# Patient Record
Sex: Female | Born: 1940 | Race: White | Hispanic: No | Marital: Married | Smoking: Never smoker
Health system: Southern US, Community
[De-identification: ages and names within clinical notes are randomized; demographics above are authoritative.]

## PROBLEM LIST (undated history)

## (undated) DIAGNOSIS — E890 Postprocedural hypothyroidism: Secondary | ICD-10-CM

## (undated) DIAGNOSIS — R519 Headache, unspecified: Secondary | ICD-10-CM

## (undated) DIAGNOSIS — C50919 Malignant neoplasm of unspecified site of unspecified female breast: Secondary | ICD-10-CM

## (undated) DIAGNOSIS — Z923 Personal history of irradiation: Secondary | ICD-10-CM

## (undated) DIAGNOSIS — K644 Residual hemorrhoidal skin tags: Secondary | ICD-10-CM

## (undated) DIAGNOSIS — H409 Unspecified glaucoma: Secondary | ICD-10-CM

## (undated) DIAGNOSIS — Z87898 Personal history of other specified conditions: Secondary | ICD-10-CM

## (undated) DIAGNOSIS — M1612 Unilateral primary osteoarthritis, left hip: Secondary | ICD-10-CM

## (undated) DIAGNOSIS — I440 Atrioventricular block, first degree: Secondary | ICD-10-CM

## (undated) DIAGNOSIS — Z973 Presence of spectacles and contact lenses: Secondary | ICD-10-CM

## (undated) DIAGNOSIS — K648 Other hemorrhoids: Secondary | ICD-10-CM

## (undated) DIAGNOSIS — Z974 Presence of external hearing-aid: Secondary | ICD-10-CM

## (undated) DIAGNOSIS — R51 Headache: Secondary | ICD-10-CM

## (undated) HISTORY — PX: TONSILLECTOMY: SUR1361

## (undated) HISTORY — PX: CARPAL TUNNEL RELEASE: SHX101

## (undated) HISTORY — PX: KNEE ARTHROSCOPY: SUR90

## (undated) HISTORY — PX: APPENDECTOMY: SHX54

## (undated) HISTORY — PX: HAMMER TOE SURGERY: SHX385

## (undated) HISTORY — PX: TUBAL LIGATION: SHX77

## (undated) HISTORY — PX: COLONOSCOPY W/ BIOPSIES AND POLYPECTOMY: SHX1376

---

## 1972-10-25 HISTORY — PX: CHOLECYSTECTOMY OPEN: SUR202

## 1997-10-25 HISTORY — PX: OTHER SURGICAL HISTORY: SHX169

## 1998-04-28 ENCOUNTER — Inpatient Hospital Stay (HOSPITAL_COMMUNITY): Admission: EM | Admit: 1998-04-28 | Discharge: 1998-04-29 | Payer: Self-pay | Admitting: Emergency Medicine

## 1999-11-11 ENCOUNTER — Encounter: Admission: RE | Admit: 1999-11-11 | Discharge: 1999-11-11 | Payer: Self-pay | Admitting: Family Medicine

## 1999-11-11 ENCOUNTER — Encounter: Payer: Self-pay | Admitting: Family Medicine

## 2000-11-11 ENCOUNTER — Encounter: Payer: Self-pay | Admitting: Family Medicine

## 2000-11-11 ENCOUNTER — Encounter: Admission: RE | Admit: 2000-11-11 | Discharge: 2000-11-11 | Payer: Self-pay | Admitting: Family Medicine

## 2001-11-14 ENCOUNTER — Encounter: Payer: Self-pay | Admitting: Family Medicine

## 2001-11-14 ENCOUNTER — Encounter: Admission: RE | Admit: 2001-11-14 | Discharge: 2001-11-14 | Payer: Self-pay | Admitting: Family Medicine

## 2002-11-15 ENCOUNTER — Encounter: Payer: Self-pay | Admitting: Family Medicine

## 2002-11-15 ENCOUNTER — Encounter: Admission: RE | Admit: 2002-11-15 | Discharge: 2002-11-15 | Payer: Self-pay | Admitting: Family Medicine

## 2002-12-10 ENCOUNTER — Encounter: Payer: Self-pay | Admitting: Family Medicine

## 2002-12-10 ENCOUNTER — Ambulatory Visit (HOSPITAL_COMMUNITY): Admission: RE | Admit: 2002-12-10 | Discharge: 2002-12-10 | Payer: Self-pay | Admitting: Family Medicine

## 2002-12-14 ENCOUNTER — Encounter: Admission: RE | Admit: 2002-12-14 | Discharge: 2002-12-14 | Payer: Self-pay | Admitting: Family Medicine

## 2002-12-14 ENCOUNTER — Encounter: Payer: Self-pay | Admitting: Family Medicine

## 2002-12-27 ENCOUNTER — Ambulatory Visit (HOSPITAL_COMMUNITY): Admission: RE | Admit: 2002-12-27 | Discharge: 2002-12-27 | Payer: Self-pay | Admitting: Family Medicine

## 2002-12-27 ENCOUNTER — Encounter: Payer: Self-pay | Admitting: Family Medicine

## 2003-09-10 ENCOUNTER — Encounter: Admission: RE | Admit: 2003-09-10 | Discharge: 2003-09-10 | Payer: Self-pay | Admitting: Family Medicine

## 2003-11-26 ENCOUNTER — Encounter: Admission: RE | Admit: 2003-11-26 | Discharge: 2003-11-26 | Payer: Self-pay | Admitting: Family Medicine

## 2003-11-29 ENCOUNTER — Other Ambulatory Visit: Admission: RE | Admit: 2003-11-29 | Discharge: 2003-11-29 | Payer: Self-pay | Admitting: Family Medicine

## 2004-05-19 ENCOUNTER — Encounter: Admission: RE | Admit: 2004-05-19 | Discharge: 2004-05-19 | Payer: Self-pay | Admitting: Family Medicine

## 2004-12-01 ENCOUNTER — Encounter: Admission: RE | Admit: 2004-12-01 | Discharge: 2004-12-01 | Payer: Self-pay | Admitting: Family Medicine

## 2004-12-01 ENCOUNTER — Other Ambulatory Visit: Admission: RE | Admit: 2004-12-01 | Discharge: 2004-12-01 | Payer: Self-pay | Admitting: Family Medicine

## 2005-12-02 ENCOUNTER — Encounter: Admission: RE | Admit: 2005-12-02 | Discharge: 2005-12-02 | Payer: Self-pay | Admitting: Family Medicine

## 2005-12-03 ENCOUNTER — Other Ambulatory Visit: Admission: RE | Admit: 2005-12-03 | Discharge: 2005-12-03 | Payer: Self-pay | Admitting: Family Medicine

## 2006-12-06 ENCOUNTER — Encounter: Admission: RE | Admit: 2006-12-06 | Discharge: 2006-12-06 | Payer: Self-pay | Admitting: Family Medicine

## 2006-12-21 ENCOUNTER — Encounter: Admission: RE | Admit: 2006-12-21 | Discharge: 2006-12-21 | Payer: Self-pay | Admitting: Family Medicine

## 2007-10-09 ENCOUNTER — Encounter: Admission: RE | Admit: 2007-10-09 | Discharge: 2007-10-09 | Payer: Self-pay | Admitting: Family Medicine

## 2007-12-22 ENCOUNTER — Other Ambulatory Visit: Admission: RE | Admit: 2007-12-22 | Discharge: 2007-12-22 | Payer: Self-pay | Admitting: Family Medicine

## 2008-01-11 ENCOUNTER — Encounter: Admission: RE | Admit: 2008-01-11 | Discharge: 2008-01-11 | Payer: Self-pay | Admitting: Family Medicine

## 2008-12-25 ENCOUNTER — Other Ambulatory Visit: Admission: RE | Admit: 2008-12-25 | Discharge: 2008-12-25 | Payer: Self-pay | Admitting: Family Medicine

## 2009-01-13 ENCOUNTER — Encounter: Admission: RE | Admit: 2009-01-13 | Discharge: 2009-01-13 | Payer: Self-pay | Admitting: Family Medicine

## 2010-01-15 ENCOUNTER — Encounter: Admission: RE | Admit: 2010-01-15 | Discharge: 2010-01-15 | Payer: Self-pay | Admitting: Family Medicine

## 2010-07-07 ENCOUNTER — Encounter (INDEPENDENT_AMBULATORY_CARE_PROVIDER_SITE_OTHER): Payer: Self-pay | Admitting: Obstetrics and Gynecology

## 2010-07-07 ENCOUNTER — Ambulatory Visit (HOSPITAL_COMMUNITY): Admission: RE | Admit: 2010-07-07 | Discharge: 2010-07-08 | Payer: Self-pay | Admitting: Obstetrics and Gynecology

## 2010-07-07 HISTORY — PX: VAGINAL HYSTERECTOMY: SUR661

## 2010-12-14 ENCOUNTER — Other Ambulatory Visit: Payer: Self-pay | Admitting: Family Medicine

## 2010-12-14 DIAGNOSIS — Z1231 Encounter for screening mammogram for malignant neoplasm of breast: Secondary | ICD-10-CM

## 2011-01-07 LAB — BASIC METABOLIC PANEL
BUN: 19 mg/dL (ref 6–23)
Calcium: 9.2 mg/dL (ref 8.4–10.5)
Creatinine, Ser: 0.81 mg/dL (ref 0.4–1.2)
Creatinine, Ser: 1.01 mg/dL (ref 0.4–1.2)
GFR calc non Af Amer: >60 mL/min (ref >60)
Glucose, Bld: 102 mg/dL — ABNORMAL HIGH (ref 70–99)
Sodium: 141 mEq/L (ref 135–145)

## 2011-01-07 LAB — CBC
HCT: 31.2 % — ABNORMAL LOW (ref 36.0–46.0)
HCT: 38.3 % (ref 36.0–46.0)
Hemoglobin: 10.9 g/dL — ABNORMAL LOW (ref 12.0–15.0)
Hemoglobin: 13.3 g/dL (ref 12.0–15.0)
MCH: 34.1 pg — ABNORMAL HIGH (ref 26.0–34.0)
MCH: 34.3 pg — ABNORMAL HIGH (ref 26.0–34.0)
MCHC: 34.9 g/dL (ref 30.0–36.0)
MCV: 98.2 fL (ref 78.0–100.0)
MCV: 98.3 fL (ref 78.0–100.0)
RBC: 3.18 MIL/uL — ABNORMAL LOW (ref 3.87–5.11)
RBC: 3.9 MIL/uL (ref 3.87–5.11)
RDW: 12.7 % (ref 11.5–15.5)

## 2011-01-07 LAB — SURGICAL PCR SCREEN: MRSA, PCR: NEGATIVE

## 2011-01-18 ENCOUNTER — Ambulatory Visit
Admission: RE | Admit: 2011-01-18 | Discharge: 2011-01-18 | Disposition: A | Discharge status: Home / Self Care | Payer: Self-pay | Source: Ambulatory Visit | Attending: Family Medicine | Admitting: Family Medicine

## 2011-01-18 DIAGNOSIS — Z1231 Encounter for screening mammogram for malignant neoplasm of breast: Secondary | ICD-10-CM

## 2011-06-02 NOTE — Patient Instructions (Addendum)
20 EDDY TERMINE  06/03/2011   Your procedure is scheduled on:  06/10/11  Report to [facility] at 0715 AM. Main entrance front desk phone [phone]  Call this number if you have problems the morning of surgery: [phone]   Remember:   Do not eat food:After Midnight.  Do not drink clear liquids: After Midnight.  Take these medicines the morning of surgery with A SIP OF WATER: thyroid med   Do not wear jewelry, make-up or nail polish.  Do not wear lotions, powders, or perfumes. You may wear deodorant.  Do not shave 48 hours prior to surgery.  Do not bring valuables to the hospital.  Contacts, dentures or bridgework may not be worn into surgery.  Leave suitcase in the car. After surgery it may be brought to your room.  For patients admitted to the hospital, checkout time is 11:00 AM the day of discharge.   Patients discharged the day of surgery will not be allowed to drive home.  Name and phone number of your driver: Dorinda Hill- spouse- [phone]  Special Instructions: CHG Shower Use Special Wash: 1/2 bottle night before surgery and 1/2 bottle morning of surgery.   Please read over the following fact sheets that you were given: Care and Recovery After Surgery

## 2011-06-03 ENCOUNTER — Encounter (HOSPITAL_COMMUNITY)
Admission: RE | Admit: 2011-06-03 | Discharge: 2011-06-03 | Disposition: A | Discharge status: Home / Self Care | Payer: Medicare Other | Source: Ambulatory Visit | Attending: Obstetrics and Gynecology | Admitting: Obstetrics and Gynecology

## 2011-06-03 ENCOUNTER — Encounter (HOSPITAL_COMMUNITY): Payer: Self-pay

## 2011-06-03 ENCOUNTER — Other Ambulatory Visit: Payer: Self-pay

## 2011-06-03 LAB — BASIC METABOLIC PANEL
BUN: 21 mg/dL (ref 6–23)
CO2: 29 mEq/L (ref 19–32)
Calcium: 10.3 mg/dL (ref 8.4–10.5)
Chloride: 101 mEq/L (ref 96–112)
Creatinine, Ser: 0.82 mg/dL (ref 0.50–1.10)

## 2011-06-03 LAB — CBC
HCT: 38.6 % (ref 36.0–46.0)
MCHC: 33.7 g/dL (ref 30.0–36.0)
MCV: 96.5 fL (ref 78.0–100.0)
Platelets: 258 10*3/uL (ref 150–400)
RDW: 13 % (ref 11.5–15.5)
WBC: 7 10*3/uL (ref 4.0–10.5)

## 2011-06-09 MED ORDER — DEXTROSE 5 % IV SOLN
2.0000 g | INTRAVENOUS | Status: DC
  Filled 2011-06-09: qty 2

## 2011-06-09 NOTE — H&P (Signed)
[name], [name] NO.:  [id number]  MEDICAL RECORD NO.:  [id number]  LOCATION:                                 FACILITY:  PHYSICIAN:  Zenaida Niece, M.D.DATE OF BIRTH:  21-Mar-1941  DATE OF ADMISSION:  06/10/2011 DATE OF DISCHARGE:                             HISTORY & PHYSICAL   CHIEF COMPLAINT:  Vaginal vault prolapse.  HISTORY OF PRESENT ILLNESS:  This is a 70 year old female para 1-0-0-1 who underwent vaginal hysterectomy with A and P repair and Solyx sling last year by Dr. Ellyn Hack.  She has presented with recurrent vaginal bulge.  Dr. Ellyn Hack first saw her in April of this year.  She felt she had a prolapsed anterior vaginal vault.  On further evaluation, I believe she has a prolapse of the apical vaginal vault with minimal cystocele and rectocele.  She tried a pessary for this, but it fell out, and she does not wish to have it replaced.  Other options have been discussed and she wishes to proceed with definitive surgical therapy and is being admitted for this at this time.  PAST OB HISTORY:  One vaginal delivery in 1973 without complications.  PAST MEDICAL HISTORY:  Thyroid disorder and history of abnormal Pap smears, hyperlipidemia.  PAST SURGICAL HISTORY:  The above-mentioned vaginal hysterectomy with A and P repair and Solyx sling, ankle surgery, appendectomy, cholecystectomy, and tonsillectomy as well as previous tubal ligation.  ALLERGIES:  None known.  CURRENT MEDICATIONS:  Baby aspirin, Betimol ophthalmic solution, fenofibrate, and Levoxyl.  FAMILY MEDICAL HISTORY:  The patient is adopted.  REVIEW OF SYSTEMS:  Otherwise negative.  She has no bowel or bladder complications.  SOCIAL HISTORY:  She is married and is sexually active and denies significant alcohol, tobacco, or drug use.  PHYSICAL EXAMINATION:  GENERAL:  This is a well-developed female in no acute distress. NECK:  Supple without lymphadenopathy or thyromegaly. LUNGS:   Clear to auscultation. HEART:  Regular rate and rhythm without murmur. ABDOMEN:  Soft, nontender, nondistended, and she does have scars in her right upper quadrant and right lower quadrant. EXTREMITIES:  No edema and are nontender. PELVIC:  External genitalia has no lesions. VAGINAL:  The vaginal apex comes to just past the introitus and she has grade 1 cystocele and rectocele and no mass.  On bimanual exam, she has no adnexal mass or tenderness.  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  The Solyx sling appears to be in good position.  She has minimal cystocele and rectocele, mostly vaginal vault prolapse.  All nonsurgical and surgical options have been discussed with the patient.  Again, she has previously tried a pessary and this did not work well.  She does wish to undergo definitive surgical therapy.  The procedure and all potential risks have been discussed with the patient.  PLAN:  To admit the patient on June 10, 2011, for minimal A and P repair and sacrospinous vaginal vault suspension.     Zenaida Niece, M.D.     TDM/MEDQ  D:  06/09/2011  T:  06/09/2011  Job:  [id number]

## 2011-06-10 ENCOUNTER — Encounter (HOSPITAL_COMMUNITY)
Admission: RE | Disposition: A | Discharge status: Home / Self Care | Payer: Self-pay | Source: Ambulatory Visit | Attending: Obstetrics and Gynecology

## 2011-06-10 ENCOUNTER — Inpatient Hospital Stay (HOSPITAL_COMMUNITY)
Admission: RE | Admit: 2011-06-10 | Discharge: 2011-06-11 | DRG: 748 | Disposition: A | Discharge status: Home / Self Care | Payer: Medicare Other | Source: Ambulatory Visit | Attending: Obstetrics and Gynecology | Admitting: Obstetrics and Gynecology

## 2011-06-10 ENCOUNTER — Encounter (HOSPITAL_COMMUNITY): Payer: Self-pay | Admitting: Anesthesiology

## 2011-06-10 ENCOUNTER — Ambulatory Visit (HOSPITAL_COMMUNITY): Payer: Medicare Other | Admitting: Anesthesiology

## 2011-06-10 DIAGNOSIS — N993 Prolapse of vaginal vault after hysterectomy: Principal | ICD-10-CM | POA: Diagnosis present

## 2011-06-10 DIAGNOSIS — Z01818 Encounter for other preprocedural examination: Secondary | ICD-10-CM

## 2011-06-10 DIAGNOSIS — Z01812 Encounter for preprocedural laboratory examination: Secondary | ICD-10-CM

## 2011-06-10 HISTORY — PX: ANTERIOR AND POSTERIOR REPAIR: SHX5121

## 2011-06-10 HISTORY — PX: VAGINAL PROLAPSE REPAIR: SHX830

## 2011-06-10 SURGERY — ANTERIOR (CYSTOCELE) AND POSTERIOR REPAIR (RECTOCELE)
Anesthesia: General | Site: Vagina | Wound class: Clean Contaminated

## 2011-06-10 MED ORDER — OXYCODONE-ACETAMINOPHEN 5-325 MG PO TABS: 1.0000 | ORAL_TABLET | ORAL | Status: DC | PRN

## 2011-06-10 MED ORDER — DEXAMETHASONE SODIUM PHOSPHATE 4 MG/ML IJ SOLN
INTRAMUSCULAR | Status: DC | PRN
  Administered 2011-06-10: 10 mg via INTRAVENOUS

## 2011-06-10 MED ORDER — SODIUM CHLORIDE 0.9 % IJ SOLN: 9.0000 mL | INTRAMUSCULAR | Status: DC | PRN

## 2011-06-10 MED ORDER — FENTANYL CITRATE 0.05 MG/ML IJ SOLN
INTRAMUSCULAR | Status: DC | PRN
  Administered 2011-06-10: 100 ug via INTRAVENOUS
  Administered 2011-06-10: 50 ug via INTRAVENOUS
  Administered 2011-06-10 (×2): 25 ug via INTRAVENOUS
  Administered 2011-06-10: 50 ug via INTRAVENOUS

## 2011-06-10 MED ORDER — FENTANYL CITRATE 0.05 MG/ML IJ SOLN
INTRAMUSCULAR | Status: AC
  Filled 2011-06-10: qty 5

## 2011-06-10 MED ORDER — PROPOFOL 10 MG/ML IV EMUL
INTRAVENOUS | Status: AC
  Filled 2011-06-10: qty 20

## 2011-06-10 MED ORDER — HYDROMORPHONE 0.3 MG/ML IV SOLN
INTRAVENOUS | Status: DC
  Administered 2011-06-10: 12:00:00 via INTRAVENOUS
  Administered 2011-06-10 – 2011-06-11 (×3): 0 mg via INTRAVENOUS

## 2011-06-10 MED ORDER — HYDROMORPHONE HCL 1 MG/ML IJ SOLN: 0.2500 mg | INTRAMUSCULAR | Status: DC | PRN

## 2011-06-10 MED ORDER — ONDANSETRON HCL 4 MG/2ML IJ SOLN
INTRAMUSCULAR | Status: AC
  Filled 2011-06-10: qty 2

## 2011-06-10 MED ORDER — DOCUSATE SODIUM 100 MG PO CAPS
100.0000 mg | ORAL_CAPSULE | Freq: Every day | ORAL | Status: DC
  Administered 2011-06-11: 100 mg via ORAL
  Filled 2011-06-10: qty 1

## 2011-06-10 MED ORDER — ONDANSETRON HCL 4 MG/2ML IJ SOLN: 4.0000 mg | Freq: Once | INTRAMUSCULAR | Status: DC | PRN

## 2011-06-10 MED ORDER — ONDANSETRON HCL 4 MG/2ML IJ SOLN
INTRAMUSCULAR | Status: DC | PRN
  Administered 2011-06-10: 4 mg via INTRAVENOUS

## 2011-06-10 MED ORDER — ZOLPIDEM TARTRATE 5 MG PO TABS: 5.0000 mg | ORAL_TABLET | Freq: Every evening | ORAL | Status: DC | PRN

## 2011-06-10 MED ORDER — KETOROLAC TROMETHAMINE 30 MG/ML IJ SOLN
INTRAMUSCULAR | Status: DC | PRN
  Administered 2011-06-10: 30 mg via INTRAVENOUS

## 2011-06-10 MED ORDER — ONDANSETRON HCL 4 MG/2ML IJ SOLN: 4.0000 mg | Freq: Four times a day (QID) | INTRAMUSCULAR | Status: DC | PRN

## 2011-06-10 MED ORDER — SODIUM CHLORIDE 0.9 % IR SOLN
Status: DC | PRN
  Administered 2011-06-10: 1000 mL

## 2011-06-10 MED ORDER — IBUPROFEN 600 MG PO TABS: 600.0000 mg | ORAL_TABLET | Freq: Four times a day (QID) | ORAL | Status: DC | PRN

## 2011-06-10 MED ORDER — SENNOSIDES-DOCUSATE SODIUM 8.6-50 MG PO TABS: 2.0000 | ORAL_TABLET | Freq: Every day | ORAL | Status: DC | PRN

## 2011-06-10 MED ORDER — DEXTROSE-NACL 5-0.45 % IV SOLN
INTRAVENOUS | Status: DC
  Administered 2011-06-10 – 2011-06-11 (×3): via INTRAVENOUS

## 2011-06-10 MED ORDER — KETOROLAC TROMETHAMINE 30 MG/ML IJ SOLN: 30.0000 mg | Freq: Once | INTRAMUSCULAR | Status: DC

## 2011-06-10 MED ORDER — MENTHOL 3 MG MT LOZG: 1.0000 | LOZENGE | OROMUCOSAL | Status: DC | PRN

## 2011-06-10 MED ORDER — IBUPROFEN 200 MG PO TABS: 200.0000 mg | ORAL_TABLET | Freq: Four times a day (QID) | ORAL | Status: DC | PRN

## 2011-06-10 MED ORDER — MIDAZOLAM HCL 5 MG/5ML IJ SOLN
INTRAMUSCULAR | Status: DC | PRN
  Administered 2011-06-10: 2 mg via INTRAVENOUS

## 2011-06-10 MED ORDER — PROPOFOL 10 MG/ML IV EMUL
INTRAVENOUS | Status: DC | PRN
  Administered 2011-06-10: 150 mg via INTRAVENOUS

## 2011-06-10 MED ORDER — FENOFIBRATE 160 MG PO TABS
160.0000 mg | ORAL_TABLET | Freq: Every day | ORAL | Status: DC
  Administered 2011-06-11: 160 mg via ORAL
  Filled 2011-06-10 (×3): qty 1

## 2011-06-10 MED ORDER — TIMOLOL MALEATE 0.5 % OP SOLN
1.0000 [drp] | Freq: Every day | OPHTHALMIC | Status: DC
  Administered 2011-06-10: 1 [drp] via OPHTHALMIC
  Filled 2011-06-10: qty 5

## 2011-06-10 MED ORDER — DIPHENHYDRAMINE HCL 50 MG/ML IJ SOLN: 12.5000 mg | Freq: Four times a day (QID) | INTRAMUSCULAR | Status: DC | PRN

## 2011-06-10 MED ORDER — MIDAZOLAM HCL 2 MG/2ML IJ SOLN
INTRAMUSCULAR | Status: AC
  Filled 2011-06-10: qty 2

## 2011-06-10 MED ORDER — ONDANSETRON HCL 4 MG PO TABS: 4.0000 mg | ORAL_TABLET | Freq: Four times a day (QID) | ORAL | Status: DC | PRN

## 2011-06-10 MED ORDER — DIPHENHYDRAMINE HCL 12.5 MG/5ML PO ELIX: 12.5000 mg | ORAL_SOLUTION | Freq: Four times a day (QID) | ORAL | Status: DC | PRN

## 2011-06-10 MED ORDER — LIDOCAINE HCL (CARDIAC) 20 MG/ML IV SOLN
INTRAVENOUS | Status: DC | PRN
  Administered 2011-06-10 (×2): 50 mg via INTRAVENOUS

## 2011-06-10 MED ORDER — LEVOTHYROXINE SODIUM 100 MCG PO TABS
100.0000 ug | ORAL_TABLET | Freq: Every day | ORAL | Status: DC
  Filled 2011-06-10 (×2): qty 1

## 2011-06-10 MED ORDER — ROCURONIUM BROMIDE 50 MG/5ML IV SOLN
INTRAVENOUS | Status: AC
  Filled 2011-06-10: qty 1

## 2011-06-10 MED ORDER — NALOXONE HCL 0.4 MG/ML IJ SOLN: 0.4000 mg | INTRAMUSCULAR | Status: DC | PRN

## 2011-06-10 MED ORDER — LIDOCAINE HCL (CARDIAC) 20 MG/ML IV SOLN
INTRAVENOUS | Status: AC
  Filled 2011-06-10: qty 5

## 2011-06-10 MED ORDER — MEPERIDINE HCL 25 MG/ML IJ SOLN: 6.2500 mg | INTRAMUSCULAR | Status: DC | PRN

## 2011-06-10 MED ORDER — LACTATED RINGERS IV SOLN
INTRAVENOUS | Status: DC
  Administered 2011-06-10 (×2): via INTRAVENOUS

## 2011-06-10 MED ORDER — DEXAMETHASONE SODIUM PHOSPHATE 10 MG/ML IJ SOLN
INTRAMUSCULAR | Status: AC
  Filled 2011-06-10: qty 1

## 2011-06-10 MED ORDER — HYDROMORPHONE 0.3 MG/ML IV SOLN
INTRAVENOUS | Status: AC
  Filled 2011-06-10: qty 25

## 2011-06-10 SURGICAL SUPPLY — 32 items
BLADE SURG 15 STRL LF C SS BP (BLADE) IMPLANT
BLADE SURG 15 STRL SS (BLADE) ×2
CLOTH BEACON ORANGE TIMEOUT ST (SAFETY) ×2 IMPLANT
CONT PATH 16OZ SNAP LID 3702 (MISCELLANEOUS) IMPLANT
DECANTER SPIKE VIAL GLASS SM (MISCELLANEOUS) IMPLANT
DEVICE CAPIO SUTURING (INSTRUMENTS)
DEVICE CAPIO SUTURING OPC (INSTRUMENTS) IMPLANT
DRAPE UTILITY XL STRL (DRAPES) ×2 IMPLANT
GAUZE PACKING 2X5 YD STERILE (GAUZE/BANDAGES/DRESSINGS) ×2 IMPLANT
GLOVE BIO SURGEON STRL SZ8 (GLOVE) ×2 IMPLANT
GLOVE BIOGEL PI IND STRL 6.5 (GLOVE) ×1 IMPLANT
GLOVE BIOGEL PI INDICATOR 6.5 (GLOVE) ×1
GLOVE ORTHO TXT STRL SZ7.5 (GLOVE) ×2 IMPLANT
GOWN PREVENTION PLUS LG XLONG (DISPOSABLE) ×6 IMPLANT
GOWN STRL REIN XL XLG (GOWN DISPOSABLE) ×2 IMPLANT
NEEDLE HYPO 22GX1.5 SAFETY (NEEDLE) IMPLANT
NEEDLE MAYO .5 CIRCLE (NEEDLE) ×1 IMPLANT
NS IRRIG 1000ML POUR BTL (IV SOLUTION) ×2 IMPLANT
PACK VAGINAL WOMENS (CUSTOM PROCEDURE TRAY) ×2 IMPLANT
SUT CAPIO POLYGLYCOLIC (SUTURE) ×4 IMPLANT
SUT PROLENE 1 CTX 30  8455H (SUTURE)
SUT PROLENE 1 CTX 30 8455H (SUTURE) IMPLANT
SUT SILK 0 SH 30 (SUTURE) ×2 IMPLANT
SUT VIC AB 2-0 CT2 27 (SUTURE) ×8 IMPLANT
SUT VIC AB 3-0 CT1 27 (SUTURE)
SUT VIC AB 3-0 CT1 TAPERPNT 27 (SUTURE) IMPLANT
SUT VIC AB 3-0 SH 27 (SUTURE)
SUT VIC AB 3-0 SH 27X BRD (SUTURE) IMPLANT
SUT VICRYL 0 UR6 27IN ABS (SUTURE) IMPLANT
TOWEL OR 17X24 6PK STRL BLUE (TOWEL DISPOSABLE) ×4 IMPLANT
TRAY FOLEY CATH 14FR (SET/KITS/TRAYS/PACK) ×2 IMPLANT
WATER STERILE IRR 1000ML POUR (IV SOLUTION) ×2 IMPLANT

## 2011-06-10 NOTE — Progress Notes (Signed)
Patient reports no change in status since H&P.

## 2011-06-10 NOTE — Progress Notes (Signed)
  DOS VS stable Pt is awake and alert and states she has used pain med one time. Catheter is draining clear yellow urine.

## 2011-06-10 NOTE — Anesthesia Preprocedure Evaluation (Signed)
Anesthesia Evaluation  Name, MR# and DOB Patient awake  General Assessment Comment  Reviewed: Allergy & Precautions, H&P , Patient's Chart, lab work & pertinent test results, reviewed documented beta blocker date and time   Airway Mallampati: II TM Distance: >3 FB Neck ROM: full    Dental  (+) Teeth Intact and Dental Advidsory Given   Pulmonary    pulmonary exam normalPulmonary Exam Normal     Cardiovascular regular Normal    Neuro/Psych Negative Neurological ROS  Negative Psych ROS  GI/Hepatic/Renal negative GI ROS, negative Liver ROS, and negative Renal ROS (+)       Endo/Other    Abdominal   Musculoskeletal   Hematology negative hematology ROS (+)   Peds  Reproductive/Obstetrics negative OB ROS    Anesthesia Other Findings Decreased T4 rx with Synthroid.            Anesthesia Physical Anesthesia Plan  ASA: II  Anesthesia Plan: General   Post-op Pain Management:    Induction: Intravenous  Airway Management Planned: LMA  Additional Equipment:   Intra-op Plan:   Post-operative Plan: Extubation in OR  Informed Consent: I have reviewed the patients History and Physical, chart, labs and discussed the procedure including the risks, benefits and alternatives for the proposed anesthesia with the patient or authorized representative who has indicated his/her understanding and acceptance.   Dental Advisory Given and History available from chart only  Plan Discussed with: CRNA  Anesthesia Plan Comments:         Anesthesia Quick Evaluation

## 2011-06-10 NOTE — Op Note (Signed)
Preoperative diagnosis: Vaginal vault prolapse minimal cystocele and rectocele Postoperative diagnosis: Same Procedure: Anterior and posterior colporrhaphy and vaginal vault suspension to the sacrospinous ligament Surgeon: Lavina Hamman M.D. Asst.: Casey Burkitt M.D. Anesthesia: Gen. with an LMA Findings: She had a minimal proximal cystocele and rectocele and significant vaginal vault prolapse, there was no significant enterocele Specimens: None Estimated blood loss: 100 cc Complications: None Procedure in detail: The patient was taken to the operating room and taken placed in the dorsosupine position. General anesthesia was induced with an LMA. She was then placed and mobile stirrups. Perineum and vagina were then prepped and draped in usual sterile fashion and bladder drained with a red Robinson catheter. Deaver retractors were used as needed anteriorly and posteriorly. I was able to identify the vaginal apex that was prolapsing to the introitus and this was grasped with Allis clamps. A transverse incision was made to remove a small piece of mucosa. Dissection was then made anteriorly from the vaginal apex to about halfway down the vagina where there was cystocele. The cystocele was then mobilized laterally sharply and bluntly with careful dissection.. This also mobilized vaginal cuff for later vaginal vault suspension. The cystocele was then reduced with interrupted sutures of 2-0 chromic. A small amount of vaginal mucosa was removed. Most of this incision was then closed vertically with running locking 2-0 Vicryl leaving a space at the apex to aid in vaginal vault suspension.  Attention was turned to the posterior repair. There appeared also to be a proximal rectocele. A small transverse incision was made about halfway up the vagina posteriorly. Midline dissection was then used to connect to the vaginal apex. The rectocele was then mobilized laterally bluntly and sharply. I was unable to dissect on  the patient's right side along the rectal pillars being careful to sweep it medially identifying the ischial spine and locating the sacrospinous ligament. I was then able to place 2 sutures of 0 permanent suture using the Capio needle device through the sacrospinous ligament. This appeared to achieve good purchase. These sutures were then tagged. Rectocele was reduced with interrupted sutures of 2-0 Vicryl. The sacrospinous sutures were then passed through the vaginal mucosa at the vaginal apex being careful not to go through and through. A hitch was made in one of the sutures to help pull the vaginal mucosa to the sacrospinous ligament. The remaining defect anteriorly was closed with a running locking 2-0 Vicryl. I then used the sacrospinous sutures to pull the vaginal apex up to the sacrospinous ligament. One of the sutures pulled through and was replaced and this achieved good vaginal vault suspension. The remaining posterior incision was closed with running locking 2-0 Vicryl with adequate closure. There did appear to be good vaginal vault suspension and all suture lines appeared to be hemostatic. The vagina was then packed with one-inch gauze soaked in Estrace cream. A Foley catheter was then inserted. A rectal exam had confirmed no rectal injury. All instruments were then removed from the vagina. The patient was taken down from stirrups. She was awakened in the operating room and taken to the recovery room in stable condition after tolerating the procedure well. She received Cefotan 2 g IV the beginning of the procedure, she had PAS hose on throughout the procedure and counts were correct x2.

## 2011-06-10 NOTE — Anesthesia Postprocedure Evaluation (Signed)
Anesthesia Post Note  Patient: [name]  Procedure(s) Performed:  ANTERIOR (CYSTOCELE) AND POSTERIOR REPAIR (RECTOCELE); VAGINAL VAULT SUSPENSION  Anesthesia type: General  Patient location: PACU  Post pain: Pain level controlled  Post assessment: Post-op Vital signs reviewed  Last Vitals:  Filed Vitals:   06/10/11 1145  BP:   Pulse:   Temp: 98.5 F (36.9 C)  Resp:     Post vital signs: Reviewed  Level of consciousness: sedated  Complications: No apparent anesthesia complicationsfj

## 2011-06-10 NOTE — Transfer of Care (Signed)
Immediate Anesthesia Transfer of Care Note  Patient: [name]  Procedure(s) Performed:  ANTERIOR (CYSTOCELE) AND POSTERIOR REPAIR (RECTOCELE); VAGINAL VAULT SUSPENSION  Patient Location: PACU  Anesthesia Type: General  Level of Consciousness: awake and sedated  Airway & Oxygen Therapy: Patient connected to nasal cannula oxygen  Post-op Assessment: Report given to PACU RN and Post -op Vital signs reviewed and stable  Post vital signs: Reviewed and stable  Complications: No apparent anesthesia complications

## 2011-06-10 NOTE — Anesthesia Procedure Notes (Signed)
Procedure Name: LMA Insertion Date/Time: 06/10/2011 9:15 AM Performed by: Isabella Bowens Pre-anesthesia Checklist: Patient identified, Patient being monitored, Timeout performed, Emergency Drugs available and Suction available Patient Re-evaluated:Patient Re-evaluated prior to inductionOxygen Delivery Method: Circle System Utilized Preoxygenation: Pre-oxygenation with 100% oxygen Intubation Type: IV induction LMA: LMA inserted LMA Size: 4.0 Grade View: Grade II

## 2011-06-11 LAB — CBC
MCHC: 33.5 g/dL (ref 30.0–36.0)
RDW: 13 % (ref 11.5–15.5)

## 2011-06-11 MED ORDER — OXYCODONE-ACETAMINOPHEN 5-325 MG PO TABS: 1.0000 | ORAL_TABLET | ORAL | Status: AC | PRN

## 2011-06-11 NOTE — Progress Notes (Signed)
POD#1 A&P repair, vaginal vault suspension Feels ok, no pain, no n/v Afeb, VSS Abd- soft Vaginal packing removed-minimal stain, foley removed Hgb 13.0 to 10.8 Doing well.  Will see how she does with ambulation and voiding, d/c home around lunch if doing ok.

## 2011-06-11 NOTE — Progress Notes (Signed)
UR Chart review completed.  

## 2011-06-14 NOTE — Discharge Summary (Signed)
NAMEELVI, [name] NO.:  [id number]  MEDICAL RECORD NO.:  [id number]  LOCATION:  9316                          FACILITY:  [facility]  PHYSICIAN:  Zenaida Weaver, M.D.DATE OF BIRTH:  10/05/1941  DATE OF ADMISSION:  06/10/2011 DATE OF DISCHARGE:  06/11/2011                              DISCHARGE SUMMARY   ADMISSION DIAGNOSES:  Vaginal vault prolapse, minimal cystocele and rectocele.  DISCHARGE DIAGNOSES:  Vaginal vault prolapse, minimal cystocele and rectocele.  PROCEDURES:  On June 10, 2011, she had anterior and posterior colporrhaphy and vaginal vault suspension to the sacrospinous ligament.  HISTORY AND PHYSICAL:  Please see chart for full dictated history and physical.  Briefly, this is a 70 year old female, para 1-0-0-1 who had vaginal hysterectomy, A and P repair and Solyx sling about a year ago by Dr. Ellyn Weaver.  She now presents with vaginal vault prolapse.  Physical exam significant for vaginal vault prolapse to the introitus and minimal proximal cystocele and rectocele.  HOSPITAL COURSE:  The patient was taken to the operating room on June 10, 2011, and underwent proximal A and P repair and vaginal vault suspension to the sacrospinous ligament using the Capio suturing device without difficulty.  Estimated blood loss was 100 mL.  Postoperatively, she did very well.  She had no significant pain, no nausea or vomiting. Preoperative hemoglobin was 13.0, postoperative was 10.8.  Vaginal packing and Foley removed on postoperative day #1 and the packing had minimal stain.  She was able to ambulate, tolerate a diet and void and was felt to be stable enough for discharge home.  DISCHARGE INSTRUCTIONS:  Regular diet, pelvic rest, no strenuous activity.  Follow up is in 2 weeks.  Medications are over-the-counter ibuprofen as needed for pain.     Zenaida Weaver, M.D.     TDM/MEDQ  D:  06/14/2011  T:  06/14/2011  Job:  [id number]

## 2011-07-05 ENCOUNTER — Encounter (HOSPITAL_COMMUNITY): Payer: Self-pay | Admitting: Obstetrics and Gynecology

## 2011-12-13 ENCOUNTER — Other Ambulatory Visit: Payer: Self-pay | Admitting: Family Medicine

## 2011-12-13 DIAGNOSIS — Z1231 Encounter for screening mammogram for malignant neoplasm of breast: Secondary | ICD-10-CM

## 2012-01-20 ENCOUNTER — Ambulatory Visit
Admission: RE | Admit: 2012-01-20 | Discharge: 2012-01-20 | Disposition: A | Discharge status: Home / Self Care | Payer: PRIVATE HEALTH INSURANCE | Source: Ambulatory Visit | Attending: Family Medicine | Admitting: Family Medicine

## 2012-01-20 DIAGNOSIS — Z1231 Encounter for screening mammogram for malignant neoplasm of breast: Secondary | ICD-10-CM

## 2012-12-18 ENCOUNTER — Other Ambulatory Visit: Payer: Self-pay | Admitting: Family Medicine

## 2012-12-18 DIAGNOSIS — Z1231 Encounter for screening mammogram for malignant neoplasm of breast: Secondary | ICD-10-CM

## 2013-01-24 ENCOUNTER — Other Ambulatory Visit: Payer: Self-pay | Admitting: Family Medicine

## 2013-01-24 ENCOUNTER — Ambulatory Visit
Admission: RE | Admit: 2013-01-24 | Discharge: 2013-01-24 | Disposition: A | Discharge status: Home / Self Care | Payer: Medicare Other | Source: Ambulatory Visit | Attending: Family Medicine | Admitting: Family Medicine

## 2013-01-24 DIAGNOSIS — R928 Other abnormal and inconclusive findings on diagnostic imaging of breast: Secondary | ICD-10-CM

## 2013-01-24 DIAGNOSIS — Z1231 Encounter for screening mammogram for malignant neoplasm of breast: Secondary | ICD-10-CM

## 2013-01-29 ENCOUNTER — Ambulatory Visit
Admission: RE | Admit: 2013-01-29 | Discharge: 2013-01-29 | Disposition: A | Discharge status: Home / Self Care | Payer: Medicare Other | Source: Ambulatory Visit | Attending: Family Medicine | Admitting: Family Medicine

## 2013-01-29 DIAGNOSIS — R928 Other abnormal and inconclusive findings on diagnostic imaging of breast: Secondary | ICD-10-CM

## 2013-05-01 ENCOUNTER — Other Ambulatory Visit: Payer: Self-pay | Admitting: Orthopedic Surgery

## 2013-05-01 DIAGNOSIS — M545 Low back pain: Secondary | ICD-10-CM

## 2013-05-02 ENCOUNTER — Other Ambulatory Visit (HOSPITAL_COMMUNITY): Payer: Self-pay | Admitting: Orthopedic Surgery

## 2013-05-09 ENCOUNTER — Encounter (HOSPITAL_COMMUNITY): Payer: Self-pay | Admitting: Pharmacy Technician

## 2013-05-09 ENCOUNTER — Ambulatory Visit
Admission: RE | Admit: 2013-05-09 | Discharge: 2013-05-09 | Disposition: A | Discharge status: Home / Self Care | Payer: Medicare Other | Source: Ambulatory Visit | Attending: Orthopedic Surgery | Admitting: Orthopedic Surgery

## 2013-05-09 DIAGNOSIS — M545 Low back pain: Secondary | ICD-10-CM

## 2013-05-15 NOTE — Pre-Procedure Instructions (Signed)
[name]  05/15/2013   Your procedure is scheduled on:  7.30.14  Report to [facility] at 830 AM.  Call this number if you have problems the morning of surgery: [phone]   Remember:   Do not eat food or drink liquids after midnight.   Take these medicines the morning of surgery with A SIP OF WATER: eye drops, thyroid,              STOP aspirin, fish oil vit e  05/18/13   Do not wear jewelry, make-up or nail polish.  Do not wear lotions, powders, or perfumes. You may wear deodorant.  Do not shave 48 hours prior to surgery. Men may shave face and neck.  Do not bring valuables to the hospital.  [facility] is not responsible                   for any belongings or valuables.  Contacts, dentures or bridgework may not be worn into surgery.  Leave suitcase in the car. After surgery it may be brought to your room.  For patients admitted to the hospital, checkout time is 11:00 AM the day of  discharge.   Patients discharged the day of surgery will not be allowed to drive  home.  Name and phone number of your driver:   Special Instructions: Shower using CHG 2 nights before surgery and the night before surgery.  If you shower the day of surgery use CHG.  Use special wash - you have one bottle of CHG for all showers.  You should use approximately 1/3 of the bottle for each shower.   Please read over the following fact sheets that you were given: Pain Booklet, Coughing and Deep Breathing, Blood Transfusion Information, MRSA Information and Surgical Site Infection Prevention

## 2013-05-16 ENCOUNTER — Encounter (HOSPITAL_COMMUNITY)
Admission: RE | Admit: 2013-05-16 | Discharge: 2013-05-16 | Disposition: A | Discharge status: Home / Self Care | Payer: Medicare Other | Source: Ambulatory Visit | Attending: Orthopedic Surgery | Admitting: Orthopedic Surgery

## 2013-05-16 ENCOUNTER — Ambulatory Visit (HOSPITAL_COMMUNITY)
Admission: RE | Admit: 2013-05-16 | Discharge: 2013-05-16 | Disposition: A | Discharge status: Home / Self Care | Payer: Medicare Other | Source: Ambulatory Visit | Attending: Orthopedic Surgery | Admitting: Orthopedic Surgery

## 2013-05-16 ENCOUNTER — Encounter (HOSPITAL_COMMUNITY): Payer: Self-pay

## 2013-05-16 DIAGNOSIS — Z01818 Encounter for other preprocedural examination: Secondary | ICD-10-CM | POA: Insufficient documentation

## 2013-05-16 DIAGNOSIS — M1710 Unilateral primary osteoarthritis, unspecified knee: Secondary | ICD-10-CM | POA: Insufficient documentation

## 2013-05-16 DIAGNOSIS — M47814 Spondylosis without myelopathy or radiculopathy, thoracic region: Secondary | ICD-10-CM | POA: Insufficient documentation

## 2013-05-16 DIAGNOSIS — I440 Atrioventricular block, first degree: Secondary | ICD-10-CM | POA: Insufficient documentation

## 2013-05-16 DIAGNOSIS — Z01812 Encounter for preprocedural laboratory examination: Secondary | ICD-10-CM | POA: Insufficient documentation

## 2013-05-16 LAB — CBC
Hemoglobin: 12.3 g/dL (ref 12.0–15.0)
MCH: 31.3 pg (ref 26.0–34.0)
MCV: 94.4 fL (ref 78.0–100.0)
Platelets: 236 10*3/uL (ref 150–400)
RBC: 3.93 MIL/uL (ref 3.87–5.11)
WBC: 6.9 10*3/uL (ref 4.0–10.5)

## 2013-05-16 LAB — ABO/RH: ABO/RH(D): A POS

## 2013-05-16 LAB — COMPREHENSIVE METABOLIC PANEL
ALT: 27 U/L (ref 0–35)
AST: 22 U/L (ref 0–37)
CO2: 25 mEq/L (ref 19–32)
Calcium: 9.7 mg/dL (ref 8.4–10.5)
Chloride: 106 mEq/L (ref 96–112)
Creatinine, Ser: 0.8 mg/dL (ref 0.50–1.10)
GFR calc Af Amer: 83 mL/min — ABNORMAL LOW (ref >90)
GFR calc non Af Amer: 72 mL/min — ABNORMAL LOW (ref >90)
Glucose, Bld: 136 mg/dL — ABNORMAL HIGH (ref 70–99)
Sodium: 140 mEq/L (ref 135–145)
Total Bilirubin: 0.4 mg/dL (ref 0.3–1.2)

## 2013-05-16 LAB — SURGICAL PCR SCREEN
MRSA, PCR: NEGATIVE
Staphylococcus aureus: NEGATIVE

## 2013-05-22 MED ORDER — CEFAZOLIN SODIUM-DEXTROSE 2-3 GM-% IV SOLR
2.0000 g | INTRAVENOUS | Status: AC
  Administered 2013-05-23: 2 g via INTRAVENOUS
  Filled 2013-05-22: qty 50

## 2013-05-23 ENCOUNTER — Encounter (HOSPITAL_COMMUNITY): Payer: Self-pay | Admitting: Anesthesiology

## 2013-05-23 ENCOUNTER — Inpatient Hospital Stay (HOSPITAL_COMMUNITY)
Admission: RE | Admit: 2013-05-23 | Discharge: 2013-05-27 | DRG: 470 | Disposition: A | Discharge status: Home Health | Payer: Medicare Other | Source: Ambulatory Visit | Attending: Orthopedic Surgery | Admitting: Orthopedic Surgery

## 2013-05-23 ENCOUNTER — Encounter (HOSPITAL_COMMUNITY)
Admission: RE | Disposition: A | Discharge status: Home Health | Payer: Self-pay | Source: Ambulatory Visit | Attending: Orthopedic Surgery

## 2013-05-23 ENCOUNTER — Inpatient Hospital Stay (HOSPITAL_COMMUNITY): Payer: Medicare Other | Admitting: Anesthesiology

## 2013-05-23 DIAGNOSIS — E039 Hypothyroidism, unspecified: Secondary | ICD-10-CM | POA: Diagnosis present

## 2013-05-23 DIAGNOSIS — H409 Unspecified glaucoma: Secondary | ICD-10-CM | POA: Diagnosis present

## 2013-05-23 DIAGNOSIS — M1710 Unilateral primary osteoarthritis, unspecified knee: Principal | ICD-10-CM | POA: Diagnosis present

## 2013-05-23 DIAGNOSIS — Z9851 Tubal ligation status: Secondary | ICD-10-CM

## 2013-05-23 DIAGNOSIS — Z9089 Acquired absence of other organs: Secondary | ICD-10-CM

## 2013-05-23 DIAGNOSIS — M1711 Unilateral primary osteoarthritis, right knee: Secondary | ICD-10-CM

## 2013-05-23 HISTORY — PX: TOTAL KNEE ARTHROPLASTY: SHX125

## 2013-05-23 SURGERY — ARTHROPLASTY, KNEE, TOTAL
Anesthesia: General | Site: Knee | Laterality: Right | Wound class: Clean

## 2013-05-23 MED ORDER — HYDROMORPHONE HCL PF 1 MG/ML IJ SOLN: 0.5000 mg | INTRAMUSCULAR | Status: DC | PRN

## 2013-05-23 MED ORDER — BUPIVACAINE LIPOSOME 1.3 % IJ SUSP
20.0000 mL | INTRAMUSCULAR | Status: DC
  Filled 2013-05-23: qty 20

## 2013-05-23 MED ORDER — SODIUM CHLORIDE 0.9 % IR SOLN
Status: DC | PRN
  Administered 2013-05-23: 3000 mL
  Administered 2013-05-23: 1000 mL

## 2013-05-23 MED ORDER — FENTANYL CITRATE 0.05 MG/ML IJ SOLN
50.0000 ug | INTRAMUSCULAR | Status: DC | PRN
  Filled 2013-05-23: qty 2

## 2013-05-23 MED ORDER — FENOFIBRATE 54 MG PO TABS
54.0000 mg | ORAL_TABLET | Freq: Every day | ORAL | Status: DC
  Administered 2013-05-23 – 2013-05-24 (×2): 54 mg via ORAL
  Filled 2013-05-23 (×3): qty 1

## 2013-05-23 MED ORDER — KETOROLAC TROMETHAMINE 15 MG/ML IJ SOLN
7.5000 mg | Freq: Four times a day (QID) | INTRAMUSCULAR | Status: AC
  Administered 2013-05-23 – 2013-05-24 (×4): 7.5 mg via INTRAVENOUS
  Filled 2013-05-23 (×4): qty 1

## 2013-05-23 MED ORDER — MIDAZOLAM HCL 2 MG/2ML IJ SOLN
1.0000 mg | INTRAMUSCULAR | Status: DC | PRN
  Filled 2013-05-23: qty 2

## 2013-05-23 MED ORDER — LACTATED RINGERS IV SOLN
INTRAVENOUS | Status: DC
Start: 2013-05-23 — End: 2013-05-23
  Administered 2013-05-23 (×2): via INTRAVENOUS

## 2013-05-23 MED ORDER — BUPIVACAINE LIPOSOME 1.3 % IJ SUSP
INTRAMUSCULAR | Status: DC | PRN
  Administered 2013-05-23: 11:00:00

## 2013-05-23 MED ORDER — OXYCODONE HCL 5 MG PO TABS: 5.0000 mg | ORAL_TABLET | Freq: Once | ORAL | Status: DC | PRN

## 2013-05-23 MED ORDER — ACETAMINOPHEN 325 MG PO TABS: 650.0000 mg | ORAL_TABLET | Freq: Four times a day (QID) | ORAL | Status: DC | PRN

## 2013-05-23 MED ORDER — LEVOTHYROXINE SODIUM 125 MCG PO TABS
125.0000 ug | ORAL_TABLET | Freq: Every day | ORAL | Status: DC
  Administered 2013-05-24 – 2013-05-27 (×4): 125 ug via ORAL
  Filled 2013-05-23 (×5): qty 1

## 2013-05-23 MED ORDER — SODIUM CHLORIDE 0.9 % IV SOLN
INTRAVENOUS | Status: DC
  Administered 2013-05-23: 20 mL/h via INTRAVENOUS

## 2013-05-23 MED ORDER — MIDAZOLAM HCL 2 MG/2ML IJ SOLN: 0.5000 mg | Freq: Once | INTRAMUSCULAR | Status: AC | PRN

## 2013-05-23 MED ORDER — ONDANSETRON HCL 4 MG/2ML IJ SOLN: 4.0000 mg | Freq: Four times a day (QID) | INTRAMUSCULAR | Status: DC | PRN

## 2013-05-23 MED ORDER — HYDROMORPHONE HCL PF 1 MG/ML IJ SOLN
INTRAMUSCULAR | Status: AC
  Administered 2013-05-23: 0.5 mg via INTRAVENOUS
  Filled 2013-05-23: qty 1

## 2013-05-23 MED ORDER — PHENOL 1.4 % MT LIQD: 1.0000 | OROMUCOSAL | Status: DC | PRN

## 2013-05-23 MED ORDER — MENTHOL 3 MG MT LOZG: 1.0000 | LOZENGE | OROMUCOSAL | Status: DC | PRN

## 2013-05-23 MED ORDER — HYDROMORPHONE HCL PF 1 MG/ML IJ SOLN
0.2500 mg | INTRAMUSCULAR | Status: DC | PRN
  Administered 2013-05-23 (×2): 0.5 mg via INTRAVENOUS

## 2013-05-23 MED ORDER — MIDAZOLAM HCL 5 MG/5ML IJ SOLN
INTRAMUSCULAR | Status: DC | PRN
  Administered 2013-05-23: 2 mg via INTRAVENOUS

## 2013-05-23 MED ORDER — ONDANSETRON HCL 4 MG PO TABS: 4.0000 mg | ORAL_TABLET | Freq: Four times a day (QID) | ORAL | Status: DC | PRN

## 2013-05-23 MED ORDER — METOCLOPRAMIDE HCL 10 MG PO TABS: 5.0000 mg | ORAL_TABLET | Freq: Three times a day (TID) | ORAL | Status: DC | PRN

## 2013-05-23 MED ORDER — LATANOPROST 0.005 % OP SOLN
1.0000 [drp] | Freq: Every day | OPHTHALMIC | Status: DC
  Administered 2013-05-23 – 2013-05-26 (×4): 1 [drp] via OPHTHALMIC
  Filled 2013-05-23: qty 2.5

## 2013-05-23 MED ORDER — MIDAZOLAM HCL 2 MG/2ML IJ SOLN
INTRAMUSCULAR | Status: AC
  Administered 2013-05-23: 2 mg via INTRAVENOUS
  Filled 2013-05-23: qty 2

## 2013-05-23 MED ORDER — OXYCODONE HCL 5 MG PO TABS
ORAL_TABLET | ORAL | Status: AC
  Filled 2013-05-23: qty 2

## 2013-05-23 MED ORDER — HYDROMORPHONE HCL PF 1 MG/ML IJ SOLN: 1.0000 mg | INTRAMUSCULAR | Status: DC | PRN

## 2013-05-23 MED ORDER — ACETAMINOPHEN 650 MG RE SUPP: 650.0000 mg | Freq: Four times a day (QID) | RECTAL | Status: DC | PRN

## 2013-05-23 MED ORDER — OXYCODONE HCL 5 MG PO TABS
5.0000 mg | ORAL_TABLET | ORAL | Status: DC | PRN
  Administered 2013-05-23 – 2013-05-27 (×15): 10 mg via ORAL
  Filled 2013-05-23 (×14): qty 2

## 2013-05-23 MED ORDER — CEFAZOLIN SODIUM-DEXTROSE 2-3 GM-% IV SOLR
2.0000 g | Freq: Four times a day (QID) | INTRAVENOUS | Status: AC
  Administered 2013-05-23 (×2): 2 g via INTRAVENOUS
  Filled 2013-05-23 (×2): qty 50

## 2013-05-23 MED ORDER — ASPIRIN EC 325 MG PO TBEC
325.0000 mg | DELAYED_RELEASE_TABLET | Freq: Every day | ORAL | Status: DC
  Administered 2013-05-24 – 2013-05-27 (×4): 325 mg via ORAL
  Filled 2013-05-23 (×5): qty 1

## 2013-05-23 MED ORDER — PROPOFOL 10 MG/ML IV BOLUS
INTRAVENOUS | Status: DC | PRN
  Administered 2013-05-23: 150 mg via INTRAVENOUS
  Administered 2013-05-23: 50 mg via INTRAVENOUS

## 2013-05-23 MED ORDER — METOCLOPRAMIDE HCL 5 MG/ML IJ SOLN: 5.0000 mg | Freq: Three times a day (TID) | INTRAMUSCULAR | Status: DC | PRN

## 2013-05-23 MED ORDER — PROMETHAZINE HCL 25 MG/ML IJ SOLN: 6.2500 mg | INTRAMUSCULAR | Status: DC | PRN

## 2013-05-23 MED ORDER — MEPERIDINE HCL 25 MG/ML IJ SOLN: 6.2500 mg | INTRAMUSCULAR | Status: DC | PRN

## 2013-05-23 MED ORDER — TIMOLOL MALEATE 0.5 % OP SOLN
1.0000 [drp] | Freq: Every morning | OPHTHALMIC | Status: DC
  Administered 2013-05-24 – 2013-05-27 (×4): 1 [drp] via OPHTHALMIC
  Filled 2013-05-23: qty 5

## 2013-05-23 MED ORDER — OXYCODONE HCL 5 MG/5ML PO SOLN: 5.0000 mg | Freq: Once | ORAL | Status: DC | PRN

## 2013-05-23 MED ORDER — FENTANYL CITRATE 0.05 MG/ML IJ SOLN
INTRAMUSCULAR | Status: DC | PRN
  Administered 2013-05-23: 50 ug via INTRAVENOUS
  Administered 2013-05-23 (×2): 100 ug via INTRAVENOUS

## 2013-05-23 SURGICAL SUPPLY — 50 items
BLADE SAGITTAL 25.0X1.27X90 (BLADE) ×2 IMPLANT
BLADE SAW SGTL 13.0X1.19X90.0M (BLADE) ×2 IMPLANT
BLADE SURG 21 STRL SS (BLADE) ×4 IMPLANT
BNDG COHESIVE 6X5 TAN STRL LF (GAUZE/BANDAGES/DRESSINGS) ×3 IMPLANT
BONE CEMENT PALACOSE (Orthopedic Implant) ×4 IMPLANT
BOWL SMART MIX CTS (DISPOSABLE) IMPLANT
CAP FLEX FEMUR MOB CROSSLINK ×1 IMPLANT
CEMENT BONE PALACOSE (Orthopedic Implant) ×2 IMPLANT
CLOTH BEACON ORANGE TIMEOUT ST (SAFETY) ×2 IMPLANT
COVER SURGICAL LIGHT HANDLE (MISCELLANEOUS) ×2 IMPLANT
CUFF TOURNIQUET SINGLE 34IN LL (TOURNIQUET CUFF) IMPLANT
CUFF TOURNIQUET SINGLE 44IN (TOURNIQUET CUFF) IMPLANT
DRAPE EXTREMITY T 121X128X90 (DRAPE) ×2 IMPLANT
DRAPE PROXIMA HALF (DRAPES) ×2 IMPLANT
DRAPE U-SHAPE 47X51 STRL (DRAPES) ×2 IMPLANT
DRSG ADAPTIC 3X8 NADH LF (GAUZE/BANDAGES/DRESSINGS) ×2 IMPLANT
DRSG PAD ABDOMINAL 8X10 ST (GAUZE/BANDAGES/DRESSINGS) ×2 IMPLANT
DURAPREP 26ML APPLICATOR (WOUND CARE) ×2 IMPLANT
ELECT REM PT RETURN 9FT ADLT (ELECTROSURGICAL) ×2
ELECTRODE REM PT RTRN 9FT ADLT (ELECTROSURGICAL) ×1 IMPLANT
FACESHIELD LNG OPTICON STERILE (SAFETY) ×2 IMPLANT
GLOVE BIOGEL PI IND STRL 9 (GLOVE) ×1 IMPLANT
GLOVE BIOGEL PI INDICATOR 9 (GLOVE) ×1
GLOVE SURG ORTHO 9.0 STRL STRW (GLOVE) ×2 IMPLANT
GOWN PREVENTION PLUS XLARGE (GOWN DISPOSABLE) ×2 IMPLANT
GOWN SRG XL XLNG 56XLVL 4 (GOWN DISPOSABLE) ×2 IMPLANT
GOWN STRL NON-REIN XL XLG LVL4 (GOWN DISPOSABLE) ×4
HANDPIECE INTERPULSE COAX TIP (DISPOSABLE)
KIT BASIN OR (CUSTOM PROCEDURE TRAY) ×2 IMPLANT
KIT ROOM TURNOVER OR (KITS) ×2 IMPLANT
MANIFOLD NEPTUNE II (INSTRUMENTS) ×2 IMPLANT
NDL SPNL 18GX3.5 QUINCKE PK (NEEDLE) ×1 IMPLANT
NEEDLE SPNL 18GX3.5 QUINCKE PK (NEEDLE) ×4 IMPLANT
NS IRRIG 1000ML POUR BTL (IV SOLUTION) ×2 IMPLANT
PACK TOTAL JOINT (CUSTOM PROCEDURE TRAY) ×2 IMPLANT
PAD ARMBOARD 7.5X6 YLW CONV (MISCELLANEOUS) ×4 IMPLANT
PADDING CAST COTTON 6X4 STRL (CAST SUPPLIES) ×2 IMPLANT
SET HNDPC FAN SPRY TIP SCT (DISPOSABLE) IMPLANT
SPONGE GAUZE 4X4 12PLY (GAUZE/BANDAGES/DRESSINGS) ×2 IMPLANT
STAPLER VISISTAT 35W (STAPLE) ×2 IMPLANT
SUCTION FRAZIER TIP 10 FR DISP (SUCTIONS) IMPLANT
SUT VIC AB 0 CTB1 27 (SUTURE) IMPLANT
SUT VIC AB 1 CTX 36 (SUTURE)
SUT VIC AB 1 CTX36XBRD ANBCTR (SUTURE) IMPLANT
SYR 20CC LL (SYRINGE) ×1 IMPLANT
TOWEL OR 17X24 6PK STRL BLUE (TOWEL DISPOSABLE) ×2 IMPLANT
TOWEL OR 17X26 10 PK STRL BLUE (TOWEL DISPOSABLE) ×2 IMPLANT
TRAY FOLEY CATH 16FRSI W/METER (SET/KITS/TRAYS/PACK) IMPLANT
WATER STERILE IRR 1000ML POUR (IV SOLUTION) ×4 IMPLANT
WRAP KNEE MAXI GEL POST OP (GAUZE/BANDAGES/DRESSINGS) ×2 IMPLANT

## 2013-05-23 NOTE — Anesthesia Procedure Notes (Signed)
Procedure Name: LMA Insertion Date/Time: 05/23/2013 10:14 AM Performed by: Leona Singleton A Pre-anesthesia Checklist: Patient identified, Emergency Drugs available, Suction available and Patient being monitored Patient Re-evaluated:Patient Re-evaluated prior to inductionOxygen Delivery Method: Circle system utilized Preoxygenation: Pre-oxygenation with 100% oxygen Intubation Type: IV induction LMA: LMA inserted LMA Size: 4.0 Tube type: Oral Number of attempts: 1 Placement Confirmation: positive ETCO2 and breath sounds checked- equal and bilateral Tube secured with: Tape Dental Injury: Teeth and Oropharynx as per pre-operative assessment

## 2013-05-23 NOTE — Progress Notes (Signed)
Orthopedic Tech Progress Note Patient Details:  [name] [birth year] [id number]  Patient ID: [name], female   DOB: November 29, 1940, 72 y.o.   MRN: [id number] Trapeze bar patient helper4  Nikki Dom 05/23/2013, 3:13 PM

## 2013-05-23 NOTE — H&P (Signed)
TOTAL KNEE ADMISSION H&P  Patient is being admitted for right total knee arthroplasty.  Subjective:  Chief Complaint:right knee pain.  HPI: [name], 72 y.o. female, has a history of pain and functional disability in the right knee due to arthritis and has failed non-surgical conservative treatments for greater than 12 weeks to includeNSAID's and/or analgesics, corticosteriod injections and activity modification.  Onset of symptoms was gradual, starting 8 years ago with gradually worsening course since that time. The patient noted prior procedures on the knee to include  arthroscopy on the right knee(s).  Patient currently rates pain in the right knee(s) at 8 out of 10 with activity. Patient has night pain, worsening of pain with activity and weight bearing, pain that interferes with activities of daily living, pain with passive range of motion, crepitus and joint swelling.  Patient has evidence of subchondral cysts, subchondral sclerosis, periarticular osteophytes and joint space narrowing by imaging studies. This patient has had Failed conservative therapy. There is no active infection.  There are no active problems to display for this patient.  Past Medical History  Diagnosis Date  . Hypothyroidism   . Arthritis   . Glaucoma     Past Surgical History  Procedure Laterality Date  . Abdominal hysterectomy    . Appendectomy    . Cholecystectomy    . Tubal ligation    . Tonsillectomy    . Anterior and posterior repair  06/10/2011    Procedure: ANTERIOR (CYSTOCELE) AND POSTERIOR REPAIR (RECTOCELE);  Surgeon: Leighton Roach Meisinger;  Location: [facility];  Service: Gynecology;  Laterality: N/A;  . Vaginal prolapse repair  06/10/2011    Procedure: VAGINAL VAULT SUSPENSION;  Surgeon: Leighton Roach Meisinger;  Location: [facility];  Service: Gynecology;  Laterality: N/A;  . Carpal tunnel release      LEFT   . Ankle surgery      RIGHT   . Hammer toe surgery      LEFT  . Knee arthroscopy      RIGHT     No prescriptions prior to admission   No Known Allergies  History  Substance Use Topics  . Smoking status: Never Smoker   . Smokeless tobacco: Not on file  . Alcohol Use: No    Family History  Problem Relation Age of Onset  . Adopted: Yes     Review of Systems  All other systems reviewed and are negative.    Objective:  Physical Exam  Vital signs in last 24 hours:    Labs:   Estimated body mass index is 28.97 kg/(m^2) as calculated from the following:   Height as of 06/03/11: 5\' 7"  (1.702 m).   Weight as of 06/03/11: 83.915 kg (185 lb).   Imaging Review Plain radiographs demonstrate moderate degenerative joint disease of the right knee(s). The overall alignment ismild varus. The bone quality appears to be adequate for age and reported activity level.  Assessment/Plan:  End stage arthritis, right knee   The patient history, physical examination, clinical judgment of the provider and imaging studies are consistent with end stage degenerative joint disease of the right knee(s) and total knee arthroplasty is deemed medically necessary. The treatment options including medical management, injection therapy arthroscopy and arthroplasty were discussed at length. The risks and benefits of total knee arthroplasty were presented and reviewed. The risks due to aseptic loosening, infection, stiffness, patella tracking problems, thromboembolic complications and other imponderables were discussed. The patient acknowledged the explanation, agreed to proceed with the plan and  consent was signed. Patient is being admitted for inpatient treatment for surgery, pain control, PT, OT, prophylactic antibiotics, VTE prophylaxis, progressive ambulation and ADL's and discharge planning. The patient is planning to be discharged home with home health services

## 2013-05-23 NOTE — Progress Notes (Signed)
Report received from [facility]. Patient is resting. No complaints at this time. VSS.

## 2013-05-23 NOTE — Progress Notes (Signed)
Dr. Jean Rosenthal notified of pts pain level of 10/10 despite 1.5 mg Dilaudid.  Additional Dilaudid and Versed ordered.

## 2013-05-23 NOTE — Progress Notes (Signed)
UR COMPLETED  

## 2013-05-23 NOTE — Progress Notes (Signed)
Orthopedic Tech Progress Note Patient Details:  [name] [birth year] [id number]  Ortho Devices Ortho Device/Splint Location: right leg Ortho Device/Splint Interventions: Application  Footsie roll Nikki Dom 05/23/2013, 6:51 PM

## 2013-05-23 NOTE — Anesthesia Postprocedure Evaluation (Signed)
  Anesthesia Post-op Note  Patient: [name]  Procedure(s) Performed: Procedure(s) with comments: Right Total Knee Arthroplasty (Right) - Right Total Knee Arthroplasty  Patient Location: PACU  Anesthesia Type:General  Level of Consciousness: sedated, patient cooperative and responds to stimulation and voice  Airway and Oxygen Therapy: Patient Spontanous Breathing and Patient connected to nasal cannula oxygen  Post-op Pain: mild  Post-op Assessment: Post-op Vital signs reviewed, Patient's Cardiovascular Status Stable, Respiratory Function Stable, Patent Airway, No signs of Nausea or vomiting and Pain level controlled  Post-op Vital Signs: Reviewed and stable  Complications: No apparent anesthesia complications

## 2013-05-23 NOTE — Anesthesia Preprocedure Evaluation (Addendum)
Anesthesia Evaluation  Patient identified by MRN, date of birth, ID band Patient awake    Reviewed: Allergy & Precautions, H&P , NPO status , Patient's Chart, lab work & pertinent test results, reviewed documented beta blocker date and time   Airway Mallampati: II TM Distance: >3 FB Neck ROM: Full    Dental  (+) Teeth Intact and Dental Advisory Given   Pulmonary neg pulmonary ROS,  breath sounds clear to auscultation  Pulmonary exam normal       Cardiovascular negative cardio ROS  Rhythm:Regular Rate:Normal     Neuro/Psych Glaucoma  negative neurological ROS  negative psych ROS   GI/Hepatic negative GI ROS, Neg liver ROS,   Endo/Other  Hypothyroidism Morbid obesity  Renal/GU negative Renal ROS  negative genitourinary   Musculoskeletal  (+) Arthritis -, Osteoarthritis,    Abdominal (+) + obese,   Peds  Hematology negative hematology ROS (+)   Anesthesia Other Findings   Reproductive/Obstetrics                         Anesthesia Physical Anesthesia Plan  ASA: II  Anesthesia Plan: General   Post-op Pain Management:    Induction: Intravenous  Airway Management Planned: LMA  Additional Equipment:   Intra-op Plan:   Post-operative Plan:   Informed Consent: I have reviewed the patients History and Physical, chart, labs and discussed the procedure including the risks, benefits and alternatives for the proposed anesthesia with the patient or authorized representative who has indicated his/her understanding and acceptance.   Dental advisory given  Plan Discussed with: CRNA and Surgeon  Anesthesia Plan Comments: (Plan routine monitors, GA- LMA OK)        Anesthesia Quick Evaluation

## 2013-05-23 NOTE — Preoperative (Signed)
Beta Blockers   Reason not to administer Beta Blockers:Not Applicable 

## 2013-05-23 NOTE — Op Note (Signed)
OPERATIVE REPORT  DATE OF SURGERY: 05/23/2013  PATIENT:  [name],  72 y.o. female  PRE-OPERATIVE DIAGNOSIS:  Osteoarthritis Right Knee  POST-OPERATIVE DIAGNOSIS:  Osteoarthritis Right Knee  PROCEDURE:  Procedure(s): Right Total Knee Arthroplasty Zimmer components. Size 4 tibia. Size E. femur. 14 mm polyethylene tray. Size 29 patella.  SURGEON:  Surgeon(s): Nadara Mustard, MD  ANESTHESIA:   general  EBL:  min ML  SPECIMEN:  No Specimen  TOURNIQUET:   Total Tourniquet Time Documented: Thigh (Right) - 29 minutes Total: Thigh (Right) - 29 minutes   PROCEDURE DETAILS: Patient is a 72 year old woman with osteoarthritis of her right knee she has failed conservative care and presents at this time for total knee arthroplasty. Risks and benefits were discussed including infection pain need for additional surgery risk for DVT. Patient states he understands and wished to proceed at this time. Description of procedure patient was brought to the operating room and underwent a general anesthetic. After adequate levels of anesthesia were obtained patient's right lower extremity was prepped using DuraPrep draped into a sterile field Ioban was used to cover all exposed skin. A midline incision was made carried down to medial parapatellar retinacular incision. IM guide was used to take 11 mm off the distal femur do to flexion contracture. 10 mm  was then taken off the tibia neutral varus valgus with a 7 posterior slope. The tibia was then sized for a size 4 and the keel punch was prepared for the size 4 tibia. Attention was then focused back on the femur and chamfer cuts were made for a size E. femur ox cut was made. The posterior aspect of the capsule was injected with long-acting Marcaine 20 cc diluted to 60 cc. The femur and tibia were then prepared trial implants were placed in the patella was resurfaced for 29 mm patella. The knee was stable with a 14 mm polyethylene tray. The trial  components were removed. The knee was irrigated with pulsatile lavage. The tibia femoral and patellar components were cemented in place the polyethylene tray was placed the knee was left in extension until the cement hardened. All loose cement was removed. The knee was then placed the range of motion varus and valgus was stable and the patella tracked midline. The tourniquet was inflated during the cementation phase after the tourniquet was deflated hemostasis was obtained. The retinaculum was closed using #1 Vicryl subcutaneous is closed using 0 Vicryl the skin was closed using staples. The wound is covered with Adaptic orthopedic sponges AB dressing web roll and Coban. Patient was placed in the easy wrap ice pack extubated taken to the PACU in stable condition.  PLAN OF CARE: Admit to inpatient   PATIENT DISPOSITION:  PACU - hemodynamically stable.   Nadara Mustard, MD 05/23/2013 11:41 AM

## 2013-05-23 NOTE — Transfer of Care (Signed)
Immediate Anesthesia Transfer of Care Note  Patient: [name]  Procedure(s) Performed: Procedure(s) with comments: Right Total Knee Arthroplasty (Right) - Right Total Knee Arthroplasty  Patient Location: PACU  Anesthesia Type:General  Level of Consciousness: awake, alert , oriented and patient cooperative  Airway & Oxygen Therapy: Patient Spontanous Breathing and Patient connected to nasal cannula oxygen  Post-op Assessment: Report given to PACU RN and Post -op Vital signs reviewed and stable  Post vital signs: Reviewed and stable  Complications: No apparent anesthesia complications

## 2013-05-24 LAB — CBC WITH DIFFERENTIAL/PLATELET
Eosinophils Absolute: 0.2 10*3/uL (ref 0.0–0.7)
Eosinophils Relative: 1 % (ref 0–5)
Lymphs Abs: 2.1 10*3/uL (ref 0.7–4.0)
MCH: 32.2 pg (ref 26.0–34.0)
MCHC: 34.4 g/dL (ref 30.0–36.0)
MCV: 93.6 fL (ref 78.0–100.0)
Monocytes Relative: 9 % (ref 3–12)
Platelets: 209 10*3/uL (ref 150–400)
RBC: 2.98 MIL/uL — ABNORMAL LOW (ref 3.87–5.11)

## 2013-05-24 LAB — BASIC METABOLIC PANEL
BUN: 22 mg/dL (ref 6–23)
Calcium: 8.8 mg/dL (ref 8.4–10.5)
GFR calc non Af Amer: 69 mL/min — ABNORMAL LOW (ref >90)
Glucose, Bld: 143 mg/dL — ABNORMAL HIGH (ref 70–99)
Sodium: 139 mEq/L (ref 135–145)

## 2013-05-24 NOTE — Progress Notes (Signed)
Patient ID: [name], female   DOB: [birth year], 72 y.o.   MRN: [id number] Postoperative day 1 left total knee replacement. Laboratory studies pending. Physical therapy progressive ambulation weightbearing as tolerated.

## 2013-05-24 NOTE — Progress Notes (Signed)
05/24/13 Spoke with patient about HHC. She selected [facility]. Contacted Trayon Krantz at [facility] and set up HPT. patient states that she already has a rolling walker and a BSC. No euipment needs identified. Will continue to follow for d/c needs. Jacquelynn Cree RN, BSN, CCM

## 2013-05-24 NOTE — Progress Notes (Signed)
Physical Therapy Treatment Patient Details Name: [name] MRN: [id number] DOB: 12/04/1940 Today's Date: 05/24/2013 Time: 4098-1191 PT Time Calculation (min): 26 min  PT Assessment / Plan / Recommendation  History of Present Illness s/p elective R TKA   PT Comments   Pt slowly progressing with therapy. Able to increase amb distance this afternoon and progress with theraex. No c/o dizziness. BP in sitting 114/67 and 108/46 in standing. Pt highly motivated to return home with husband. Will cont to f/u with pt to maximize functional mobility.   Follow Up Recommendations  Home health PT;Supervision/Assistance - 24 hour     Does the patient have the potential to tolerate intense rehabilitation     Barriers to Discharge        Equipment Recommendations  None recommended by PT    Recommendations for Other Services OT consult  Frequency 7X/week   Progress towards PT Goals Progress towards PT goals: Progressing toward goals  Plan Current plan remains appropriate    Precautions / Restrictions Precautions Precautions: Fall;Knee Restrictions Weight Bearing Restrictions: Yes RLE Weight Bearing: Weight bearing as tolerated   Pertinent Vitals/Pain 2/10; pt premedicated     Mobility  Bed Mobility Bed Mobility: Supine to Sit;Sit to Supine Supine to Sit: 4: Min assist;HOB elevated;With rails Sit to Supine: 4: Min assist;HOB flat Details for Bed Mobility Assistance: (A) to advance R LE off/on bed; instructed pt to hook L LE under R LE to increase indepdence with bed mobility; pt required vc's and increased time to complete bed mobility  Transfers Transfers: Sit to Stand;Stand to Sit Sit to Stand: From bed;4: Min guard Stand to Sit: 4: Min guard;To bed Details for Transfer Assistance: min guard to stablize pt with transfers and mod vc's for hand placement and sequencing Ambulation/Gait Ambulation/Gait Assistance: 4: Min guard Ambulation Distance (Feet): 14 Feet Assistive device:  Rolling walker Ambulation/Gait Assistance Details: mod vc's for gt sequencing and upright posture; encouraged pt to increase heel strike on R LE and rely less on UEs for support; pt amb with step to gt  Gait Pattern: Step-to pattern;Decreased stance time - right;Decreased step length - left;Trunk flexed Gait velocity: decreased Stairs: No Wheelchair Mobility Wheelchair Mobility: No    Exercises Total Joint Exercises Ankle Circles/Pumps: AROM;10 reps;Supine Quad Sets: AROM;Right;10 reps Heel Slides: AROM;10 reps;Right;Seated Hip ABduction/ADduction: AROM;10 reps;Right;Supine Knee Flexion: Strengthening;AROM;10 reps;Seated Goniometric ROM: 0 to 90 degrees AROM in sitting    PT Diagnosis:    PT Problem List:   PT Treatment Interventions:     PT Goals (current goals can now be found in the care plan section) Acute Rehab PT Goals Patient Stated Goal: to go home with husband PT Goal Formulation: With patient Time For Goal Achievement: 05/31/13 Potential to Achieve Goals: Good  Visit Information  Last PT Received On: 05/24/13 Assistance Needed: +1 History of Present Illness: s/p elective R TKA    Subjective Data  Subjective: pt lying supine; agreeable to therapy. stated "i didnt get dizzy last time i got up"  Patient Stated Goal: to go home with husband   Cognition  Cognition Arousal/Alertness: Awake/alert Behavior During Therapy: WFL for tasks assessed/performed Overall Cognitive Status: Within Functional Limits for tasks assessed    Balance  Balance Balance Assessed: No  End of Session PT - End of Session Equipment Utilized During Treatment: Gait belt Activity Tolerance: Patient tolerated treatment well Patient left: in bed;with call bell/phone within reach Nurse Communication: Mobility status   GP     [place], [place]  N, PT [id number] 05/24/2013, 3:18 PM

## 2013-05-25 ENCOUNTER — Encounter (HOSPITAL_COMMUNITY): Payer: Self-pay | Admitting: Orthopedic Surgery

## 2013-05-25 LAB — CBC WITH DIFFERENTIAL/PLATELET
Basophils Absolute: 0 10*3/uL (ref 0.0–0.1)
Basophils Relative: 0 % (ref 0–1)
Eosinophils Absolute: 0.4 10*3/uL (ref 0.0–0.7)
MCH: 32.8 pg (ref 26.0–34.0)
MCHC: 35.1 g/dL (ref 30.0–36.0)
Neutrophils Relative %: 63 % (ref 43–77)
Platelets: 190 10*3/uL (ref 150–400)

## 2013-05-25 MED ORDER — OXYCODONE-ACETAMINOPHEN 5-325 MG PO TABS: 1.0000 | ORAL_TABLET | ORAL | Status: DC | PRN

## 2013-05-25 MED ORDER — FENOFIBRATE 54 MG PO TABS
134.0000 mg | ORAL_TABLET | Freq: Every day | ORAL | Status: DC
  Administered 2013-05-26: 134 mg via ORAL
  Filled 2013-05-25 (×3): qty 1

## 2013-05-25 MED ORDER — ASPIRIN EC 81 MG PO TBEC: 81.0000 mg | DELAYED_RELEASE_TABLET | Freq: Every day | ORAL | Status: DC

## 2013-05-25 NOTE — Progress Notes (Signed)
Physical Therapy Treatment Patient Details Name: [name] MRN: [id number] DOB: [birth year] Today's Date: 05/25/2013 Time: 4098-1191 PT Time Calculation (min): 25 min  PT Assessment / Plan / Recommendation  History of Present Illness s/p elective R TKA   PT Comments   Pt making good progress with mobility and theraex. C/o mild dizziness with amb this afternoon and required standing rest break for dizziness to subside. Pt highly motivated to D/C home and hopes to do so tomorrow. Pt has steps but reports she has an elevator type system setup by her husband; however, would like to attempt the steps if possible prior to D/C.   Follow Up Recommendations  Home health PT;Supervision/Assistance - 24 hour     Does the patient have the potential to tolerate intense rehabilitation     Barriers to Discharge        Equipment Recommendations  None recommended by PT    Recommendations for Other Services OT consult  Frequency 7X/week   Progress towards PT Goals Progress towards PT goals: Progressing toward goals  Plan Current plan remains appropriate    Precautions / Restrictions Precautions Precautions: Fall;Knee Restrictions Weight Bearing Restrictions: Yes RLE Weight Bearing: Weight bearing as tolerated   Pertinent Vitals/Pain 4/10     Mobility  Bed Mobility Bed Mobility: Supine to Sit Supine to Sit: With rails;HOB flat;4: Min guard Details for Bed Mobility Assistance: pt able to come to long sit from flat bed; requires min guard to control Rt LE off bed; increased time due to pain and min vc's for sequencing required Transfers Transfers: Sit to Stand;Stand to Sit Sit to Stand: 5: Supervision;From bed Stand to Sit: 5: Supervision;To chair/3-in-1 Details for Transfer Assistance: pt performed toilet transfer with supervision for safety; requires increased time due to pain; supervision for safety and min vc's for hand placement  Ambulation/Gait Ambulation/Gait Assistance: 5:  Supervision Ambulation Distance (Feet): 120 Feet Assistive device: Rolling walker Ambulation/Gait Assistance Details: pt required 1 standing rest break due to fatigue; vc's for gt sequencing to increase step length and progress to step through gt and upright posture  Gait Pattern: Step-to pattern;Step-through pattern;Decreased step length - left;Decreased stance time - left Gait velocity: decreased Stairs: No Wheelchair Mobility Wheelchair Mobility: No    Exercises Total Joint Exercises Ankle Circles/Pumps: Both;10 reps;Supine Quad Sets: Right;Seated Heel Slides: AROM;Right;10 reps;Seated Straight Leg Raises: 5 reps;Right;AAROM;Other (comment) (by using bed sheet ) Knee Flexion: AROM;10 reps;Left;Standing   PT Diagnosis:    PT Problem List:   PT Treatment Interventions:     PT Goals (current goals can now be found in the care plan section) Acute Rehab PT Goals Patient Stated Goal: to go home with husband PT Goal Formulation: With patient Time For Goal Achievement: 05/31/13 Potential to Achieve Goals: Good  Visit Information  Last PT Received On: 05/25/13 Assistance Needed: +1 History of Present Illness: s/p elective R TKA    Subjective Data  Subjective: pt lying supine; agreeable to therapy. Son present Patient Stated Goal: to go home with husband   Cognition  Cognition Arousal/Alertness: Awake/alert Behavior During Therapy: WFL for tasks assessed/performed Overall Cognitive Status: Within Functional Limits for tasks assessed    Balance  Balance Balance Assessed: No  End of Session PT - End of Session Equipment Utilized During Treatment: Gait belt Activity Tolerance: Patient tolerated treatment well Patient left: in chair;with call bell/phone within reach;with family/visitor present Nurse Communication: Mobility status   GP     Donell Sievert, [place] [phone] 05/25/2013, 3:08  PM   

## 2013-05-25 NOTE — Progress Notes (Signed)
OT Cancellation Note  Patient Details Name: [name] MRN: [id number] DOB: 1941/02/12   Cancelled Treatment:    Reason Eval/Treat Not Completed: OT screened, no needs identified, will sign off. Pt states she has a 3in1 and husband can help at d/c. She states he helped a lot when she broke her ankle. She plans to sponge bathe initially educated pt on where to obtain tub DME if desired/needed. She doesn't feel she needs to practice any of the ADL for OT.  Judithann Sauger [place] [phone] 05/25/2013, 1:08 PM

## 2013-05-25 NOTE — Progress Notes (Signed)
Patient ID: [name], female   DOB: [birth year], 72 y.o.   MRN: [id number] Postoperative day 2 right total knee arthroplasty. Patient is progressing well with therapy. Anticipate discharge to home this weekend. Prescription for Percocet and aspirin on the chart.

## 2013-05-25 NOTE — Progress Notes (Signed)
Physical Therapy Treatment Patient Details Name: [name] MRN: [id number] DOB: [birth year] Today's Date: 05/25/2013 Time: 1478-2956 PT Time Calculation (min): 24 min  PT Assessment / Plan / Recommendation  History of Present Illness s/p elective R TKA   PT Comments   Pt progressing well with mobility. Able to increase amb distance today and participate in theraex.  Pt motivated to return home with husband this weekend. Fatigues easily with amb, primarily in UEs. Will benefit from skilled PT to maximize functional mobility.   Follow Up Recommendations  Home health PT;Supervision/Assistance - 24 hour     Does the patient have the potential to tolerate intense rehabilitation     Barriers to Discharge        Equipment Recommendations  None recommended by PT    Recommendations for Other Services OT consult  Frequency 7X/week   Progress towards PT Goals Progress towards PT goals: Progressing toward goals  Plan Current plan remains appropriate    Precautions / Restrictions Precautions Precautions: Fall;Knee Precaution Comments: given HEP handout  Restrictions Weight Bearing Restrictions: Yes RLE Weight Bearing: Weight bearing as tolerated   Pertinent Vitals/Pain 7-8/10 with flexion primarily.     Mobility  Bed Mobility Bed Mobility: Not assessed Details for Bed Mobility Assistance: pt sitting in chair and returned to chair Transfers Transfers: Sit to Stand;Stand to Sit Sit to Stand: 5: Supervision;From chair/3-in-1;With armrests Stand to Sit: 5: Supervision;To chair/3-in-1 Details for Transfer Assistance: supervision for safety and min cues for hand placement/sequencing Ambulation/Gait Ambulation/Gait Assistance: 4: Min guard Ambulation Distance (Feet): 80 Feet Assistive device: Rolling walker Ambulation/Gait Assistance Details: vc's and demo for gt sequencing to increase heel strike on R LE and increase step length on L LE to progress to step through gt; pt began to  progress to step through gt at end of amb; pt reports UEs became fatigued due to decreased ability to WB on R LE  Gait Pattern: Step-to pattern;Decreased stance time - right;Decreased step length - left;Trunk flexed;Step-through pattern Gait velocity: decreased Stairs: No Wheelchair Mobility Wheelchair Mobility: No    Exercises Total Joint Exercises Ankle Circles/Pumps: Both;10 reps;Seated Quad Sets: Right;Seated Heel Slides: AROM;Right;10 reps;Seated Hip ABduction/ADduction: AROM;10 reps;Right;Seated Long Arc Quad: AAROM;Right;10 reps;Seated Knee Flexion: AROM;Left;5 reps;Standing Goniometric ROM: R knee flex to 90 degres AAROM    PT Diagnosis:    PT Problem List:   PT Treatment Interventions:     PT Goals (current goals can now be found in the care plan section) Acute Rehab PT Goals Patient Stated Goal: to go home with husband PT Goal Formulation: With patient Time For Goal Achievement: 05/31/13 Potential to Achieve Goals: Good  Visit Information  Last PT Received On: 05/25/13 Assistance Needed: +1 History of Present Illness: s/p elective R TKA    Subjective Data  Subjective: pt sitting in chair; agreeable to therapy. stated " the doctor said i would go home sometime this weekend"  Patient Stated Goal: to go home with husband   Cognition  Cognition Arousal/Alertness: Awake/alert Behavior During Therapy: WFL for tasks assessed/performed Overall Cognitive Status: Within Functional Limits for tasks assessed    Balance  Balance Balance Assessed: No  End of Session PT - End of Session Equipment Utilized During Treatment: Gait belt Activity Tolerance: Patient tolerated treatment well Patient left: in chair;with call bell/phone within reach Nurse Communication: Mobility status   GP     Donell Sievert, [place] [phone] 05/25/2013, 10:19 AM

## 2013-05-26 LAB — CBC WITH DIFFERENTIAL/PLATELET
Basophils Absolute: 0 10*3/uL (ref 0.0–0.1)
HCT: 25.4 % — ABNORMAL LOW (ref 36.0–46.0)
Lymphocytes Relative: 29 % (ref 12–46)
Lymphs Abs: 3.2 10*3/uL (ref 0.7–4.0)
Monocytes Absolute: 1.1 10*3/uL — ABNORMAL HIGH (ref 0.1–1.0)
Neutro Abs: 6.1 10*3/uL (ref 1.7–7.7)
Platelets: 192 10*3/uL (ref 150–400)
RBC: 2.71 MIL/uL — ABNORMAL LOW (ref 3.87–5.11)
RDW: 13.2 % (ref 11.5–15.5)
WBC: 10.9 10*3/uL — ABNORMAL HIGH (ref 4.0–10.5)

## 2013-05-26 NOTE — Progress Notes (Signed)
Subjective: Pain controlled - ambulating in hall - no stairs yet  Objective: Vital signs in last 24 hours: Temp:  [99 F (37.2 C)-99.4 F (37.4 C)] 99 F (37.2 C) (08/02 0552) Pulse Rate:  [86-88] 86 (08/02 0552) Resp:  [16-18] 16 (08/02 0828) BP: (132-138)/(59) 132/59 mmHg (08/02 0552) SpO2:  [97 %-98 %] 98 % (08/02 0828)  Intake/Output from previous day: 08/01 0701 - 08/02 0700 In: 960 [P.O.:960] Out: -  Intake/Output this shift:    Exam:  Neurovascular intact Sensation intact distally Intact pulses distally  Labs:  Recent Labs  05/24/13 0940 05/25/13 0542 05/26/13 0526  HGB 9.6* 8.7* 8.8*    Recent Labs  05/25/13 0542 05/26/13 0526  WBC 10.9* 10.9*  RBC 2.65* 2.71*  HCT 24.8* 25.4*  PLT 190 192    Recent Labs  05/24/13 0940  NA 139  K 3.6  CL 105  CO2 27  BUN 22  CREATININE 0.83  GLUCOSE 143*  CALCIUM 8.8   No results found for this basename: LABPT, INR,  in the last 72 hours  Assessment/Plan: Plan dc am after PT today - pt doing well   [name] 05/26/2013, 8:59 AM

## 2013-05-26 NOTE — Progress Notes (Signed)
Physical Therapy Treatment Patient Details Name: [name] MRN: [id number] DOB: 1941-01-15 Today's Date: 05/26/2013 Time: 4098-1191 PT Time Calculation (min): 26 min  PT Assessment / Plan / Recommendation  History of Present Illness s/p elective R TKA   PT Comments   Patient able to tolerate stair training this afternoon. Anticipate DC tomorrow  Follow Up Recommendations  Home health PT;Supervision/Assistance - 24 hour     Does the patient have the potential to tolerate intense rehabilitation     Barriers to Discharge        Equipment Recommendations  None recommended by PT    Recommendations for Other Services    Frequency 7X/week   Progress towards PT Goals Progress towards PT goals: Progressing toward goals  Plan Current plan remains appropriate    Precautions / Restrictions Precautions Precautions: Fall;Knee Restrictions RLE Weight Bearing: Weight bearing as tolerated   Pertinent Vitals/Pain no apparent distress     Mobility  Bed Mobility Supine to Sit: 5: Supervision;With rails Sitting - Scoot to Edge of Bed: 5: Supervision Sit to Supine: 5: Supervision Details for Bed Mobility Assistance: Able to get LEs back into bed without assistance Transfers Sit to Stand: From bed;6: Modified independent (Device/Increase time);From toilet Stand to Sit: To bed;6: Modified independent (Device/Increase time);To toilet Ambulation/Gait Ambulation/Gait Assistance: 5: Supervision Ambulation Distance (Feet): 250 Feet Assistive device: Rolling walker Ambulation/Gait Assistance Details: one seated rest break after steps Gait Pattern: Step-through pattern;Step-to pattern Gait velocity: decreased Stairs: Yes Stairs Assistance: 4: Min guard Stair Management Technique: Step to pattern;Sideways;One rail Right Number of Stairs: 7    Exercises Total Joint Exercises Quad Sets: Right;Seated;10 reps Heel Slides: AROM;Right;10 reps;Seated Hip ABduction/ADduction: AROM;10  reps;Right;Seated Straight Leg Raises: Right;AAROM;Other (comment);10 reps Long Arc Quad: AAROM;Right;10 reps;Seated   PT Diagnosis:    PT Problem List:   PT Treatment Interventions:     PT Goals (current goals can now be found in the care plan section)    Visit Information  Last PT Received On: 05/26/13 Assistance Needed: +1 History of Present Illness: s/p elective R TKA    Subjective Data      Cognition  Cognition Arousal/Alertness: Awake/alert Behavior During Therapy: WFL for tasks assessed/performed Overall Cognitive Status: Within Functional Limits for tasks assessed    Balance     End of Session PT - End of Session Equipment Utilized During Treatment: Gait belt Activity Tolerance: Patient tolerated treatment well Patient left: in bed;with call bell/phone within reach Nurse Communication: Mobility status   GP     Fredrich Birks 05/26/2013, 2:04 PM 05/26/2013 Fredrich Birks PTA [phone] pager [phone] office

## 2013-05-26 NOTE — Progress Notes (Signed)
Physical Therapy Treatment Patient Details Name: [name] MRN: [id number] DOB: [birth year] Today's Date: 05/26/2013 Time: 4098-1191 PT Time Calculation (min): 25 min  PT Assessment / Plan / Recommendation  History of Present Illness s/p elective R TKA   PT Comments   Patient progressing well. Will attempt steps later today.  Follow Up Recommendations  Home health PT;Supervision/Assistance - 24 hour     Does the patient have the potential to tolerate intense rehabilitation     Barriers to Discharge        Equipment Recommendations  None recommended by PT    Recommendations for Other Services    Frequency 7X/week   Progress towards PT Goals Progress towards PT goals: Progressing toward goals  Plan Current plan remains appropriate    Precautions / Restrictions Precautions Precautions: Fall;Knee Restrictions RLE Weight Bearing: Weight bearing as tolerated   Pertinent Vitals/Pain no apparent distress    Mobility  Bed Mobility Supine to Sit: 5: Supervision;With rails Sitting - Scoot to Edge of Bed: 5: Supervision Sit to Supine: 4: Min assist;HOB flat Details for Bed Mobility Assistance: A for R LE back into bed Transfers Sit to Stand: From bed;6: Modified independent (Device/Increase time) Stand to Sit: To bed;6: Modified independent (Device/Increase time) Ambulation/Gait Ambulation/Gait Assistance: 5: Supervision Ambulation Distance (Feet): 150 Feet Assistive device: Rolling walker Ambulation/Gait Assistance Details: Cues for posture Gait Pattern: Step-to pattern;Step-through pattern;Decreased step length - left;Decreased stance time - left Gait velocity: decreased    Exercises Total Joint Exercises Quad Sets: Right;Seated;10 reps Heel Slides: AROM;Right;10 reps;Seated Hip ABduction/ADduction: AROM;10 reps;Right;Seated Straight Leg Raises: Right;AAROM;Other (comment);10 reps Long Arc Quad: AAROM;Right;10 reps;Seated   PT Diagnosis:    PT Problem List:    PT Treatment Interventions:     PT Goals (current goals can now be found in the care plan section)    Visit Information  Last PT Received On: 05/26/13 Assistance Needed: +1 History of Present Illness: s/p elective R TKA    Subjective Data      Cognition  Cognition Arousal/Alertness: Awake/alert Behavior During Therapy: WFL for tasks assessed/performed Overall Cognitive Status: Within Functional Limits for tasks assessed    Balance     End of Session PT - End of Session Equipment Utilized During Treatment: Gait belt Activity Tolerance: Patient tolerated treatment well Patient left: in chair;with call bell/phone within reach   GP     Fredrich Birks 05/26/2013, 10:22 AM  05/26/2013 Fredrich Birks PTA [phone] pager [phone] office

## 2013-05-27 NOTE — Progress Notes (Signed)
Physical Therapy Treatment Patient Details Name: [name] MRN: [id number] DOB: Jan 14, 1941 Today's Date: 05/27/2013 Time: 4098-1191 PT Time Calculation (min): 28 min  PT Assessment / Plan / Recommendation  History of Present Illness s/p elective R TKA   PT Comments   Very good progress; Questions answered; OK for dc home from PT standpoint  Follow Up Recommendations  Home health PT;Supervision/Assistance - 24 hour     Does the patient have the potential to tolerate intense rehabilitation     Barriers to Discharge        Equipment Recommendations  None recommended by PT    Recommendations for Other Services    Frequency 7X/week   Progress towards PT Goals Progress towards PT goals: Progressing toward goals  Plan Current plan remains appropriate    Precautions / Restrictions Precautions Precautions: Knee Restrictions Weight Bearing Restrictions: Yes RLE Weight Bearing: Weight bearing as tolerated   Pertinent Vitals/Pain 1/10 post amb RN provided medication to assist with pain control     Mobility  Bed Mobility Bed Mobility: Supine to Sit;Sit to Supine Supine to Sit: With rails;6: Modified independent (Device/Increase time) Sitting - Scoot to Edge of Bed: 6: Modified independent (Device/Increase time) Sit to Supine: 6: Modified independent (Device/Increase time) Details for Bed Mobility Assistance: Able to get LEs back into bed without assistance; used LLE to assist RLE Transfers Transfers: Sit to Stand;Stand to Sit Sit to Stand: From bed;6: Modified independent (Device/Increase time);From toilet Stand to Sit: To bed;6: Modified independent (Device/Increase time);To toilet Details for Transfer Assistance: Painful R knee with sitting down Ambulation/Gait Ambulation/Gait Assistance: 5: Supervision;6: Modified independent (Device/Increase time) Ambulation Distance (Feet): 250 Feet Assistive device: Rolling walker Ambulation/Gait Assistance Details: No need for  rest break; Natural step-through pattern Gait Pattern: Step-through pattern;Step-to pattern Stairs: Yes Stairs Assistance: 4: Min guard Stair Management Technique: Step to pattern;Sideways;One rail Right Number of Stairs: 7    Exercises Total Joint Exercises Quad Sets: AROM;Right;10 reps Heel Slides: AAROM;Right;10 reps Straight Leg Raises: AAROM;Right;10 reps Long Arc Quad: AAROM;Right;10 reps;Seated   PT Diagnosis:    PT Problem List:   PT Treatment Interventions:     PT Goals (current goals can now be found in the care plan section) Acute Rehab PT Goals Patient Stated Goal: to go home with husband Time For Goal Achievement: 05/31/13 Potential to Achieve Goals: Good  Visit Information  Last PT Received On: 05/27/13 Assistance Needed: +1 History of Present Illness: s/p elective R TKA    Subjective Data  Subjective: wanting to go home Patient Stated Goal: to go home with husband   Cognition  Cognition Arousal/Alertness: Awake/alert Behavior During Therapy: WFL for tasks assessed/performed Overall Cognitive Status: Within Functional Limits for tasks assessed    Balance     End of Session PT - End of Session Equipment Utilized During Treatment: Gait belt Activity Tolerance: Patient tolerated treatment well Patient left: in chair;with call bell/phone within reach;with family/visitor present Nurse Communication: Mobility status   GP     Olen Pel [place], [place] [phone]  05/27/2013, 10:58 AM

## 2013-05-27 NOTE — Progress Notes (Signed)
   CARE MANAGEMENT NOTE 05/27/2013  Patient:  [name], [name]   Account Number:  [id number]  Date Initiated:  05/24/2013  Documentation initiated by:  [facility]  Subjective/Objective Assessment:   admitted postop rt total knee arthroplasty     Action/Plan:   PT eval- recommended HHPTl   Anticipated DC Date:  05/25/2013   Anticipated DC Plan:  HOME W HOME HEALTH SERVICES      DC Planning Services  CM consult      Choice offered to / List presented to:  C-1 Patient        HH arranged  HH-2 PT      [facility] agency  [facility]   Status of service:  Completed, signed off Medicare Important Message given?   (If response is "NO", the following Medicare IM given date fields will be blank) Date Medicare IM given:   Date Additional Medicare IM given:    Discharge Disposition:  HOME W HOME HEALTH SERVICES  Per UR Regulation:    If discussed at Long Length of Stay Meetings, dates discussed:    Comments:  05/27/2013 1020 NCM notified [facility] of pt's dc home today. Isidoro Donning RN CCM Case Mgmt phone [phone]  05/24/13 Spoke with patient about HHC. She selected [facility]. Contacted Mary at [facility] and set up HPT. patient states that she already has a rolling walker and a BSC. No euipment needs identified. Will continue to follow for d/c needs. Jacquelynn Cree RN, BSN, CCM

## 2013-05-27 NOTE — Progress Notes (Signed)
Subjective: Pt stable - doing well   Objective: Vital signs in last 24 hours: Temp:  [97.7 F (36.5 C)-99.1 F (37.3 C)] 98.9 F (37.2 C) (08/03 0557) Pulse Rate:  [81-87] 84 (08/03 0557) Resp:  [16-20] 20 (08/03 0557) BP: (102-138)/(50-82) 127/50 mmHg (08/03 0557) SpO2:  [97 %-100 %] 100 % (08/03 0557)  Intake/Output from previous day: 08/02 0701 - 08/03 0700 In: 480 [P.O.:480] Out: 400 [Urine:400] Intake/Output this shift: Total I/O In: 360 [P.O.:360] Out: -   Exam:  Neurovascular intact Sensation intact distally Intact pulses distally  Labs:  Recent Labs  05/24/13 0940 05/25/13 0542 05/26/13 0526  HGB 9.6* 8.7* 8.8*    Recent Labs  05/25/13 0542 05/26/13 0526  WBC 10.9* 10.9*  RBC 2.65* 2.71*  HCT 24.8* 25.4*  PLT 190 192    Recent Labs  05/24/13 0940  NA 139  K 3.6  CL 105  CO2 27  BUN 22  CREATININE 0.83  GLUCOSE 143*  CALCIUM 8.8   No results found for this basename: LABPT, INR,  in the last 72 hours  Assessment/Plan: Dc today   DEAN,GREGORY SCOTT 05/27/2013, 8:04 AM

## 2013-05-28 NOTE — Discharge Summary (Signed)
Physician Discharge Summary  Patient ID: [name] MRN: [id number] DOB/AGE: 1940-11-26 72 y.o.  Admit date: 05/23/2013 Discharge date: 05/28/2013  Admission Diagnoses: Osteoarthritis right knee  Discharge Diagnoses: Osteoarthritis right knee Active Problems:   * No active hospital problems. *   Discharged Condition: stable  Hospital Course: Patient's hospital course was essentially unremarkable. She underwent total knee arthroplasty. Postoperatively she progressed well and was discharged to home in stable condition.  Consults: None  Significant Diagnostic Studies: labs: Routine labs  Treatments: surgery: See operative note  Discharge Exam: Blood pressure 127/50, pulse 84, temperature 98.9 F (37.2 C), temperature source Oral, resp. rate 18, SpO2 99.00%. Incision/Wound: incision clean dry and intact at time of discharge  Disposition: 06-Home-Health Care Svc  Discharge Orders   Future Orders Complete By Expires     Call MD / Call 911  As directed     Comments:      If you experience chest pain or shortness of breath, CALL 911 and be transported to the hospital emergency room.  If you develope a fever above 101 F, pus (white drainage) or increased drainage or redness at the wound, or calf pain, call your surgeon's office.    Call MD / Call 911  As directed     Comments:      If you experience chest pain or shortness of breath, CALL 911 and be transported to the hospital emergency room.  If you develope a fever above 101 F, pus (white drainage) or increased drainage or redness at the wound, or calf pain, call your surgeon's office.    Constipation Prevention  As directed     Comments:      Drink plenty of fluids.  Prune juice may be helpful.  You may use a stool softener, such as Colace (over the counter) 100 mg twice a day.  Use MiraLax (over the counter) for constipation as needed.    Constipation Prevention  As directed     Comments:      Drink plenty of fluids.  Prune  juice may be helpful.  You may use a stool softener, such as Colace (over the counter) 100 mg twice a day.  Use MiraLax (over the counter) for constipation as needed.    Diet - low sodium heart healthy  As directed     Diet - low sodium heart healthy  As directed     Increase activity slowly as tolerated  As directed     Increase activity slowly as tolerated  As directed         Medication List         aspirin 81 MG tablet  Take 81 mg by mouth every evening.     aspirin EC 81 MG tablet  Take 1 tablet (81 mg total) by mouth daily.     CALTRATE 600 PLUS-VIT D PO  Take 2 tablets by mouth every evening.     fenofibrate micronized 134 MG capsule  Commonly known as:  LOFIBRA  Take 134 mg by mouth every evening.     Fish Oil 1200 MG Caps  Take 1 capsule by mouth 2 (two) times daily.     Fish Oil 1000 MG Caps  Take 2,000 capsules by mouth 2 (two) times daily.     latanoprost 0.005 % ophthalmic solution  Commonly known as:  XALATAN  Place 1 drop into both eyes at bedtime.     levothyroxine 125 MCG tablet  Commonly known as:  SYNTHROID, LEVOTHROID  Take 125 mcg by mouth daily before breakfast.     multivitamin per tablet  Take 1 tablet by mouth daily.     oxyCODONE-acetaminophen 5-325 MG per tablet  Commonly known as:  ROXICET  Take 1 tablet by mouth every 4 (four) hours as needed for pain.     timolol 0.5 % ophthalmic solution  Commonly known as:  TIMOPTIC  Place 1 drop into both eyes every morning.     vitamin E 400 UNIT capsule  Take 400 Units by mouth every morning.           Follow-up Information   Follow up with Nadara Mustard, MD.   Contact information:   [address] [place] [address] [phone]       Signed: Nadara Mustard 05/28/2013, 6:10 AM

## 2013-06-06 ENCOUNTER — Encounter (HOSPITAL_COMMUNITY): Payer: Self-pay | Admitting: Emergency Medicine

## 2013-06-06 ENCOUNTER — Emergency Department (HOSPITAL_COMMUNITY)
Admission: EM | Admit: 2013-06-06 | Discharge: 2013-06-06 | Disposition: A | Discharge status: Home / Self Care | Payer: PRIVATE HEALTH INSURANCE | Attending: Emergency Medicine | Admitting: Emergency Medicine

## 2013-06-06 DIAGNOSIS — M129 Arthropathy, unspecified: Secondary | ICD-10-CM | POA: Insufficient documentation

## 2013-06-06 DIAGNOSIS — IMO0001 Reserved for inherently not codable concepts without codable children: Secondary | ICD-10-CM | POA: Insufficient documentation

## 2013-06-06 DIAGNOSIS — Z7982 Long term (current) use of aspirin: Secondary | ICD-10-CM | POA: Insufficient documentation

## 2013-06-06 DIAGNOSIS — E039 Hypothyroidism, unspecified: Secondary | ICD-10-CM | POA: Insufficient documentation

## 2013-06-06 DIAGNOSIS — L509 Urticaria, unspecified: Secondary | ICD-10-CM | POA: Insufficient documentation

## 2013-06-06 DIAGNOSIS — R197 Diarrhea, unspecified: Secondary | ICD-10-CM | POA: Insufficient documentation

## 2013-06-06 DIAGNOSIS — H409 Unspecified glaucoma: Secondary | ICD-10-CM | POA: Insufficient documentation

## 2013-06-06 DIAGNOSIS — M2550 Pain in unspecified joint: Secondary | ICD-10-CM | POA: Insufficient documentation

## 2013-06-06 DIAGNOSIS — L539 Erythematous condition, unspecified: Secondary | ICD-10-CM | POA: Insufficient documentation

## 2013-06-06 DIAGNOSIS — Z79899 Other long term (current) drug therapy: Secondary | ICD-10-CM | POA: Insufficient documentation

## 2013-06-06 LAB — CBC WITH DIFFERENTIAL/PLATELET
HCT: 32.1 % — ABNORMAL LOW (ref 36.0–46.0)
Hemoglobin: 10.8 g/dL — ABNORMAL LOW (ref 12.0–15.0)
Lymphocytes Relative: 11 % — ABNORMAL LOW (ref 12–46)
MCV: 94.1 fL (ref 78.0–100.0)
Monocytes Absolute: 0.5 10*3/uL (ref 0.1–1.0)
Monocytes Relative: 3 % (ref 3–12)
Neutro Abs: 13.2 10*3/uL — ABNORMAL HIGH (ref 1.7–7.7)
WBC: 15.4 10*3/uL — ABNORMAL HIGH (ref 4.0–10.5)

## 2013-06-06 MED ORDER — DIPHENHYDRAMINE HCL 25 MG PO CAPS: 25.0000 mg | ORAL_CAPSULE | Freq: Four times a day (QID) | ORAL | Status: DC | PRN

## 2013-06-06 MED ORDER — DIPHENHYDRAMINE HCL 25 MG PO CAPS
25.0000 mg | ORAL_CAPSULE | Freq: Once | ORAL | Status: AC
  Administered 2013-06-06: 25 mg via ORAL
  Filled 2013-06-06: qty 1

## 2013-06-06 MED ORDER — PREDNISONE 20 MG PO TABS
60.0000 mg | ORAL_TABLET | Freq: Once | ORAL | Status: AC
  Administered 2013-06-06: 60 mg via ORAL
  Filled 2013-06-06: qty 3

## 2013-06-06 MED ORDER — PREDNISONE 20 MG PO TABS: 60.0000 mg | ORAL_TABLET | Freq: Every day | ORAL | Status: DC

## 2013-06-06 NOTE — ED Notes (Addendum)
Pt reports splotchy, itchy rash beginning Monday night to face, arms, neck, legs, and groin. Pt alert, oriented x4, resp even unlabored. No oral swelling noted. Airway patent. States seen by PMD yesterday given steroid shot. States no improvement in symptoms.

## 2013-06-06 NOTE — ED Provider Notes (Signed)
CSN: [id number]     Arrival date & time 06/06/13  0906 History     First MD Initiated Contact with Patient 06/06/13 0913     Chief Complaint  Patient presents with  . Rash   (Consider location/radiation/quality/duration/timing/severity/associated sxs/prior Treatment) Patient is a 72 y.o. female presenting with rash.  Rash Associated symptoms: diarrhea and myalgias   Associated symptoms: no fatigue, no fever, no headaches, no nausea, no shortness of breath, not vomiting and not wheezing    Keyaria Lawson is a 72 y.o F s/p TKA 07/30 without complications who presents for 2 day onset of rash and facial swelling. 2 nights ago patient noticed bilateral hands to be pruritic and erythematous since that time irritation progressed up bilateral hands to involve upper extremities. Yesterday she presented to her PCP where she was given zyrtec and steroid (depomedrol 80mg ) for symptoms and was told she had an allergic rxn without an identifiable cause. Patient denied outdoor exposure, bug bites, recent changes in medications, sick contacts. Was otherwise well ROS neg for diarrhea, nausea, vomiting, confusion fevers or chills. No respiratory distress or discomfort. Lesions and spread have gotten progressively worse, prompting patient to present to the ED this am.   Past Medical History  Diagnosis Date  . Hypothyroidism   . Arthritis   . Glaucoma    Past Surgical History  Procedure Laterality Date  . Abdominal hysterectomy    . Appendectomy    . Cholecystectomy    . Tubal ligation    . Tonsillectomy    . Anterior and posterior repair  06/10/2011    Procedure: ANTERIOR (CYSTOCELE) AND POSTERIOR REPAIR (RECTOCELE);  Surgeon: Leighton Roach Meisinger;  Location: [facility] ORS;  Service: Gynecology;  Laterality: N/A;  . Vaginal prolapse repair  06/10/2011    Procedure: VAGINAL VAULT SUSPENSION;  Surgeon: Leighton Roach Meisinger;  Location: [facility] OR[facility];  Service: Gynecology;  Laterality: N/A;  . Carpal tunnel release      LEFT    . Ankle surgery      RIGHT   . Hammer toe surgery      LEFT  . Knee arthroscopy      RIGHT  . Total knee arthroplasty Right 05/23/2013    Procedure: Right Total Knee Arthroplasty;  Surgeon: Nadara Mustard, MD;  Location: [facility];  Service: Orthopedics;  Laterality: Right;  Right Total Knee Arthroplasty   Family History  Problem Relation Age of Onset  . Adopted: Yes   History  Substance Use Topics  . Smoking status: Never Smoker   . Smokeless tobacco: Not on file  . Alcohol Use: No    Review of Systems  Constitutional: Negative for fever, malaise/fatigue and fatigue.  HENT: Negative for neck pain and neck stiffness.   Eyes: Negative for blurred vision.  Respiratory: Negative for shortness of breath and wheezing.   Cardiovascular: Negative for chest pain and leg swelling.  Gastrointestinal: Positive for diarrhea. Negative for nausea and vomiting.  Musculoskeletal: Positive for myalgias and joint pain.  Skin: Positive for itching and rash.  Allergic/Immunologic: Negative for environmental allergies.  Neurological: Negative for dizziness and headaches.   Review of Systems  Constitutional: Negative for fever, malaise/fatigue and fatigue.  HENT: Negative for neck pain and neck stiffness.   Eyes: Negative for blurred vision.  Respiratory: Negative for shortness of breath and wheezing.   Cardiovascular: Negative for chest pain and leg swelling.  Gastrointestinal: Positive for diarrhea. Negative for nausea and vomiting.  Musculoskeletal: Positive for myalgias and joint pain.  Skin: Positive for itching and rash.  Neurological: Negative for dizziness and headaches.  Endo/Heme/Allergies: Negative for environmental allergies.     Allergies  Review of patient's allergies indicates no known allergies.  Home Medications   Current Outpatient Rx  Name  Route  Sig  Dispense  Refill  . aspirin EC 81 MG tablet   Oral   Take 1 tablet (81 mg total) by mouth daily.   30 tablet   1    . Calcium-Vitamin D (CALTRATE 600 PLUS-VIT D PO)   Oral   Take 2 tablets by mouth every evening.          . fenofibrate micronized (LOFIBRA) 134 MG capsule   Oral   Take 134 mg by mouth every evening.          . latanoprost (XALATAN) 0.005 % ophthalmic solution   Both Eyes   Place 1 drop into both eyes at bedtime.         Marland Kitchen levothyroxine (SYNTHROID, LEVOTHROID) 125 MCG tablet   Oral   Take 125 mcg by mouth daily before breakfast.         . multivitamin (THERAGRAN) per tablet   Oral   Take 1 tablet by mouth daily.          . Omega-3 Fatty Acids (FISH OIL) 1000 MG CAPS   Oral   Take 2,000 capsules by mouth 2 (two) times daily.         . timolol (TIMOPTIC) 0.5 % ophthalmic solution   Both Eyes   Place 1 drop into both eyes every morning.          . vitamin E 400 UNIT capsule   Oral   Take 400 Units by mouth every morning.          BP 118/46  Pulse 86  Temp(Src) 100 F (37.8 C) (Oral)  Resp 20  SpO2 98% Physical Exam  Constitutional: She is oriented to person, place, and time. She appears well-developed.  HENT:  Head: Normocephalic and atraumatic.  Mouth/Throat: No oropharyngeal exudate.  Eyes: Pupils are equal, round, and reactive to light.  Neck: Normal range of motion. Neck supple. No thyromegaly present.  Cardiovascular: Normal rate, regular rhythm, normal heart sounds and intact distal pulses.   Pulmonary/Chest: Breath sounds normal. No respiratory distress. She has no wheezes.  Abdominal: Soft. Bowel sounds are normal. She exhibits no distension and no mass. There is no tenderness. There is no rebound and no guarding.  Musculoskeletal: She exhibits no edema and no tenderness.  Neurological: She is alert and oriented to person, place, and time. No cranial nerve deficit.  Skin: Lesion and rash noted. Rash is maculopapular. There is erythema.  Extensive rash over bilateral palms inner thighs abdomen back and now involving face and lips.  Mild  bruising on inner thumb to index finger area.  ED Course   Procedures will give prednisone 60, benadryl 25 tx of urticaria CBC given bruising on right hand  Labs Reviewed  CBC WITH DIFFERENTIAL - Abnormal; Notable for the following:    WBC 15.4 (*)    RBC 3.41 (*)    Hemoglobin 10.8 (*)    HCT 32.1 (*)    Platelets 538 (*)    Neutrophils Relative % 86 (*)    Neutro Abs 13.2 (*)    Lymphocytes Relative 11 (*)    All other components within normal limits   No results found. No diagnosis found.  MDM  72 y/o F with  no previous hx of allergies/exposures s/p TKA July 30 without complication here for 2 days for facial swelling and rash not associated with changes to medication regimen.   DDx includes environmental exposure, viral exthanthem, tick borne illness, drug eruption. Patient has progressively gotten worse since she saw her PCP yesterday, where she was given zyrtec and steroids. No signs of laryngeal edema or angioedema or anaphlaxis at this time. No clear bug bite or outdoor exposures suggestive of tick borne. No recent ingestions. No systemic complaints suggestive of viral exthanthem. Antihistamines first line therapy in urticaria which is not recurrent or chronic in nature. Will treat with additional pred and benadryl.   CBC sig for elevated platelets 538 most likely acute phase reactant, hgb near pt baseline at 10.8 (7/31 9.6).   Patient has appointment with ortho today for post op care at 12:30. Will d/c with prednisone 60mg  for 5 days. And benadryl 25mg  prn  Anselm Lis, MD 06/06/13 1102

## 2013-06-06 NOTE — ED Notes (Signed)
Dr Pickering at bedside 

## 2013-06-07 NOTE — ED Provider Notes (Signed)
I saw and evaluated the patient, reviewed the resident's note and I agree with the findings and plan. Also has some redness on hands likely from excoriation. Has ecchymotic area also. Platelets are normal. Will discharge home with steroids. No clear airway involvement  Juliet Rude. Rubin Payor, MD 06/07/13 [phone]

## 2013-09-12 ENCOUNTER — Emergency Department (HOSPITAL_COMMUNITY)
Admission: EM | Admit: 2013-09-12 | Discharge: 2013-09-12 | Disposition: A | Discharge status: Home / Self Care | Payer: PRIVATE HEALTH INSURANCE | Source: Home / Self Care | Attending: Emergency Medicine | Admitting: Emergency Medicine

## 2013-09-12 ENCOUNTER — Encounter (HOSPITAL_COMMUNITY): Payer: Self-pay | Admitting: Emergency Medicine

## 2013-09-12 DIAGNOSIS — S61213A Laceration without foreign body of left middle finger without damage to nail, initial encounter: Secondary | ICD-10-CM

## 2013-09-12 DIAGNOSIS — S61209A Unspecified open wound of unspecified finger without damage to nail, initial encounter: Secondary | ICD-10-CM

## 2013-09-12 MED ORDER — HYDROCODONE-ACETAMINOPHEN 5-325 MG PO TABS: ORAL_TABLET | ORAL | Status: DC

## 2013-09-12 NOTE — ED Notes (Signed)
Laceration to left middle finger tip, cut finger with kitchen knife, cutting a pie

## 2013-09-12 NOTE — ED Provider Notes (Signed)
Chief Complaint:   Chief Complaint  Patient presents with  . Laceration    History of Present Illness:   [name] is a 72 year old female who lacerated the tip of her left middle finger at 5:30 PM today at home while cutting a pie with a knife. She has been to get the bleeding stopped. Her last tetanus vaccine was in 2005. She has full range of motion of all digits and there is no numbness or tingling.  Review of Systems:  Other than noted above, the patient denies any of the following symptoms: Systemic:  No fever or chills. Musculoskeletal:  No joint pain or decreased range of motion. Neuro:  No numbness, tingling, or weakness.  PMFSH:  Past medical history, family history, social history, meds, and allergies were reviewed. She has no medication allergies. She takes fenofibrate, eye drops, and Synthroid. She has glaucoma, elevated triglycerides, and hypothyroidism.  Physical Exam:   Vital signs:  BP 141/67  Pulse 75  Temp(Src) 97.7 F (36.5 C) (Oral)  Resp 16  SpO2 100% Ext:  There is a 1.5 cm laceration across the volar tip of the left middle finger. Flexor and extensor tendons were intact, bleeding was controlled, no evidence of infection or foreign body.  All other joints had a full ROM without pain.  Pulses were full.  Good capillary refill in all digits.  No edema. Neurological:  Alert and oriented.  No muscle weakness.  Sensation was intact to light touch.   Procedure: Verbal informed consent was obtained.  The patient was informed of the risks and benefits of the procedure and understands and accepts.  Identity of the patient was verified verbally and by wristband.   The laceration area described above was prepped with Betadine and saline  and anesthetized with a digital block with 5 mL of 2% Xylocaine without epinephrine.  The wound was then closed as follows:  The skin edges were approximated with 5 5-0 nylon simple interrupted sutures.  There were no immediate  complications, and the patient tolerated the procedure well. The laceration was then cleansed, Bacitracin ointment was applied and a clean, dry pressure dressing was put on.   Assessment:  The encounter diagnosis was Laceration of left middle finger w/o foreign body w/o damage to nail, initial encounter.  Plan:   1.  Meds:  The following meds were prescribed:   Discharge Medication List as of 09/12/2013  8:25 PM    START taking these medications   Details  HYDROcodone-acetaminophen (NORCO/VICODIN) 5-325 MG per tablet 1 to 2 tabs every 4 to 6 hours as needed for pain., Print        2.  Patient Education/Counseling:  The patient was given appropriate handouts, self care instructions, and instructed in symptomatic relief. Instructions were given for wound care.    3.  Follow up:  The patient was told to follow up immediately if there is any sign of infection.The patient will return in 14 days for suture removal.     Reuben Likes, MD 09/12/13 2111

## 2013-12-25 ENCOUNTER — Other Ambulatory Visit: Payer: Self-pay

## 2013-12-25 DIAGNOSIS — Z1231 Encounter for screening mammogram for malignant neoplasm of breast: Secondary | ICD-10-CM

## 2014-01-04 ENCOUNTER — Other Ambulatory Visit: Payer: Self-pay | Admitting: Orthopedic Surgery

## 2014-01-04 DIAGNOSIS — M545 Low back pain, unspecified: Secondary | ICD-10-CM

## 2014-01-15 ENCOUNTER — Ambulatory Visit
Admission: RE | Admit: 2014-01-15 | Discharge: 2014-01-15 | Disposition: A | Discharge status: Home / Self Care | Payer: Medicare Other | Source: Ambulatory Visit | Attending: Orthopedic Surgery | Admitting: Orthopedic Surgery

## 2014-01-15 DIAGNOSIS — M545 Low back pain, unspecified: Secondary | ICD-10-CM

## 2014-01-25 ENCOUNTER — Ambulatory Visit
Admission: RE | Admit: 2014-01-25 | Discharge: 2014-01-25 | Disposition: A | Discharge status: Home / Self Care | Payer: Medicare Other | Source: Ambulatory Visit

## 2014-01-25 DIAGNOSIS — Z1231 Encounter for screening mammogram for malignant neoplasm of breast: Secondary | ICD-10-CM

## 2014-04-24 ENCOUNTER — Other Ambulatory Visit: Payer: Self-pay

## 2014-04-24 DIAGNOSIS — R002 Palpitations: Secondary | ICD-10-CM

## 2014-05-06 ENCOUNTER — Encounter: Payer: Self-pay | Admitting: *Deleted

## 2014-05-06 ENCOUNTER — Ambulatory Visit (INDEPENDENT_AMBULATORY_CARE_PROVIDER_SITE_OTHER): Payer: Medicare Other | Admitting: Radiology

## 2014-05-06 DIAGNOSIS — R002 Palpitations: Secondary | ICD-10-CM

## 2014-05-06 NOTE — Progress Notes (Signed)
Patient ID: [name], female   DOB: [birth year], 73 y.o.   MRN: [id number] Verite 30 day cardiac event monitor applied to patient.

## 2014-05-06 NOTE — Progress Notes (Signed)
Patient ID: [name], female   DOB: Apr 23, 1941, 73 y.o.   MRN: [id number] Verite 30 day cardiac event monitor applied to patient.

## 2014-10-25 DIAGNOSIS — Z923 Personal history of irradiation: Secondary | ICD-10-CM

## 2014-10-25 HISTORY — DX: Personal history of irradiation: Z92.3

## 2014-12-23 ENCOUNTER — Other Ambulatory Visit: Payer: Self-pay

## 2014-12-23 DIAGNOSIS — Z1231 Encounter for screening mammogram for malignant neoplasm of breast: Secondary | ICD-10-CM

## 2015-02-03 ENCOUNTER — Ambulatory Visit
Admission: RE | Admit: 2015-02-03 | Discharge: 2015-02-03 | Disposition: A | Discharge status: Home / Self Care | Payer: Medicare Other | Source: Ambulatory Visit

## 2015-02-03 DIAGNOSIS — Z1231 Encounter for screening mammogram for malignant neoplasm of breast: Secondary | ICD-10-CM

## 2015-02-04 ENCOUNTER — Other Ambulatory Visit: Payer: Self-pay | Admitting: Family Medicine

## 2015-02-04 DIAGNOSIS — R928 Other abnormal and inconclusive findings on diagnostic imaging of breast: Secondary | ICD-10-CM

## 2015-02-06 ENCOUNTER — Ambulatory Visit
Admission: RE | Admit: 2015-02-06 | Discharge: 2015-02-06 | Disposition: A | Discharge status: Home / Self Care | Payer: Medicare Other | Source: Ambulatory Visit | Attending: Family Medicine | Admitting: Family Medicine

## 2015-02-06 ENCOUNTER — Other Ambulatory Visit: Payer: Self-pay | Admitting: Family Medicine

## 2015-02-06 DIAGNOSIS — R928 Other abnormal and inconclusive findings on diagnostic imaging of breast: Secondary | ICD-10-CM

## 2015-02-13 ENCOUNTER — Other Ambulatory Visit: Payer: Self-pay | Admitting: Family Medicine

## 2015-02-13 DIAGNOSIS — R928 Other abnormal and inconclusive findings on diagnostic imaging of breast: Secondary | ICD-10-CM

## 2015-02-20 ENCOUNTER — Ambulatory Visit
Admission: RE | Admit: 2015-02-20 | Discharge: 2015-02-20 | Disposition: A | Discharge status: Home / Self Care | Payer: Medicare Other | Source: Ambulatory Visit | Attending: Family Medicine | Admitting: Family Medicine

## 2015-02-20 ENCOUNTER — Other Ambulatory Visit: Payer: Self-pay | Admitting: Family Medicine

## 2015-02-20 DIAGNOSIS — R928 Other abnormal and inconclusive findings on diagnostic imaging of breast: Secondary | ICD-10-CM

## 2015-02-21 ENCOUNTER — Other Ambulatory Visit: Payer: Self-pay | Admitting: Family Medicine

## 2015-02-21 DIAGNOSIS — C50911 Malignant neoplasm of unspecified site of right female breast: Secondary | ICD-10-CM

## 2015-02-23 HISTORY — PX: BREAST LUMPECTOMY: SHX2

## 2015-02-25 ENCOUNTER — Ambulatory Visit
Admission: RE | Admit: 2015-02-25 | Discharge: 2015-02-25 | Disposition: A | Discharge status: Home / Self Care | Payer: Medicare Other | Source: Ambulatory Visit | Attending: Family Medicine | Admitting: Family Medicine

## 2015-02-25 DIAGNOSIS — C50911 Malignant neoplasm of unspecified site of right female breast: Secondary | ICD-10-CM

## 2015-02-25 MED ORDER — GADOBENATE DIMEGLUMINE 529 MG/ML IV SOLN
18.0000 mL | Freq: Once | INTRAVENOUS | Status: AC | PRN
  Administered 2015-02-25: 18 mL via INTRAVENOUS

## 2015-02-28 ENCOUNTER — Telehealth: Payer: Self-pay | Admitting: *Deleted

## 2015-02-28 ENCOUNTER — Other Ambulatory Visit: Payer: Self-pay | Admitting: General Surgery

## 2015-02-28 DIAGNOSIS — C50211 Malignant neoplasm of upper-inner quadrant of right female breast: Secondary | ICD-10-CM

## 2015-02-28 NOTE — Telephone Encounter (Signed)
Attempted to call pt with med onc appt to see Dr. Lindi Adie. No answering machine to leave a msg. Will call back again to get her scheduled.

## 2015-03-03 ENCOUNTER — Telehealth: Payer: Self-pay | Admitting: *Deleted

## 2015-03-03 NOTE — Telephone Encounter (Signed)
Left vm for pt to return call regarding new pt appt.

## 2015-03-04 ENCOUNTER — Ambulatory Visit
Admission: RE | Admit: 2015-03-04 | Discharge: 2015-03-04 | Disposition: A | Discharge status: Home / Self Care | Payer: Medicare Other | Source: Ambulatory Visit | Attending: Radiation Oncology | Admitting: Radiation Oncology

## 2015-03-04 ENCOUNTER — Encounter: Payer: Self-pay | Admitting: Radiation Oncology

## 2015-03-04 VITALS — BP 137/67 | HR 64 | Temp 97.8°F | Ht 67.0 in | Wt 192.2 lb

## 2015-03-04 DIAGNOSIS — C50211 Malignant neoplasm of upper-inner quadrant of right female breast: Secondary | ICD-10-CM | POA: Diagnosis not present

## 2015-03-04 HISTORY — DX: Malignant neoplasm of unspecified site of unspecified female breast: C50.919

## 2015-03-04 NOTE — Progress Notes (Signed)
[facility] NEW PATIENT EVALUATION  Name: [name] MRN: [id number]  Date:   03/04/2015           DOB: 1941-01-06  Status: outpatient   CC: Mayra Neer, MD  Rolm Bookbinder, MD, Dr. Autumn Messing III, Dr. 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 female who is seen today through the courtesy of Drs. Wakefield/Toth for evaluation of her clinical stage T1 N0 invasive ductal/DCIS of the right breast.  At the time of a screening mammogram on 02/03/2015 she was felt to have possible distortion in the right breast.  Additional views confirmed distortion along the medial right breast.  Targeted ultrasound was without evidence for a sonographic correlate.  Stereotactic biopsy of distortion at 2:00 within the upper inner quadrant of the right breast on 02/20/2015 was diagnostic for invasive ductal carcinoma with DCIS.  The tumor was felt to be grade 1.  I do not see hormone receptor data at the time of this dictation.  Breast MR on 02/25/2015 showed a 1.3 x 0.6 x 0.6 cm bilobed area of nodularity with biopsy changes within the upper inner quadrant of the right breast.  There were no other suspicious lesions and no abnormal appearing lymph nodes.  The patient tells me that she was seen by  Dr. Marlou Starks  (although official referral from Dr. Donne Hazel), and the patient is interested in breast conservation surgery.  I understand that she will see Dr. Lindi Adie for medical oncology consultation.  PREVIOUS RADIATION THERAPY: No   PAST MEDICAL HISTORY:  has a past medical history of Hypothyroidism; Arthritis; Glaucoma; and Breast cancer.     PAST SURGICAL HISTORY:  Past Surgical History  Procedure Laterality Date  . Abdominal hysterectomy    . Appendectomy    . Cholecystectomy    . Tubal ligation    .  Tonsillectomy    . Anterior and posterior repair  06/10/2011    Procedure: ANTERIOR (CYSTOCELE) AND POSTERIOR REPAIR (RECTOCELE);  Surgeon: Blane Ohara Meisinger;  Location: [facility];  Service: Gynecology;  Laterality: N/A;  . Vaginal prolapse repair  06/10/2011    Procedure: VAGINAL VAULT SUSPENSION;  Surgeon: Blane Ohara Meisinger;  Location: [facility];  Service: Gynecology;  Laterality: N/A;  . Carpal tunnel release      LEFT   . Ankle surgery      RIGHT   . Hammer toe surgery      LEFT  . Knee arthroscopy      RIGHT  . Total knee arthroplasty Right 05/23/2013    Procedure: Right Total Knee Arthroplasty;  Surgeon: Newt Minion, MD;  Location: [facility];  Service: Orthopedics;  Laterality: Right;  Right Total Knee Arthroplasty     FAMILY HISTORY: family history is not on file. She was adopted.    SOCIAL HISTORY:  reports that she has never smoked. She does not have any smokeless tobacco history on file. She reports that she does not drink alcohol or use illicit drugs.  Married, one child, and one adopted child.  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   ALLERGIES: Review of patient's allergies indicates no known allergies.   MEDICATIONS:  Current Outpatient Prescriptions  Medication Sig Dispense Refill  . aspirin EC 81 MG tablet Take 1 tablet (81  mg total) by mouth daily. 30 tablet 1  . bimatoprost (LUMIGAN) 0.01 % SOLN 1 drop at bedtime.    . Calcium-Vitamin D (CALTRATE 600 PLUS-VIT D PO) Take 2 tablets by mouth every evening.     . dorzolamide-timolol (COSOPT) 22.3-6.8 MG/ML ophthalmic solution     . ezetimibe (ZETIA) 10 MG tablet Take 10 mg by mouth daily.    Marland Kitchen levothyroxine (SYNTHROID, LEVOTHROID) 112 MCG tablet Take 112 mcg by mouth daily.  11  . multivitamin (THERAGRAN) per tablet Take 1 tablet by mouth daily.     . Omega-3 Fatty Acids (FISH OIL) 1000 MG CAPS Take 2,000 capsules by mouth 2 (two) times daily.    Marland Kitchen PROCTOFOAM HC rectal foam   1  . vitamin  E 400 UNIT capsule Take 400 Units by mouth every morning.     No current facility-administered medications for this encounter.     REVIEW OF SYSTEMS:  Pertinent items are noted in HPI.    PHYSICAL EXAM:  height is 5\' 7"  (1.702 m) and weight is 192 lb 3.2 oz (87.181 kg). Her temperature is 97.8 F (36.6 C). Her blood pressure is 137/67 and her pulse is 64.   Alert and oriented 74 year old white female appearing younger than her stated age.  Head and neck examination: Grossly unremarkable.  Nodes: Without palpable cervical, supraclavicular, or axillary lymphadenopathy.  Breasts: There is a bruise and biopsy wound along the upper inner quadrant of the right breast at 2:00.  No masses are appreciated.  Left breast without masses or lesions.  Extremities: Without edema.   LABORATORY DATA:  Lab Results  Component Value Date   WBC 15.4* 06/06/2013   HGB 10.8* 06/06/2013   HCT 32.1* 06/06/2013   MCV 94.1 06/06/2013   PLT 538* 06/06/2013   Lab Results  Component Value Date   NA 139 05/24/2013   K 3.6 05/24/2013   CL 105 05/24/2013   CO2 27 05/24/2013   Lab Results  Component Value Date   ALT 27 05/16/2013   AST 22 05/16/2013   ALKPHOS 44 05/16/2013   BILITOT 0.4 05/16/2013      IMPRESSION: Clinical stage I A (T1 N0 M0) invasive ductal/DCIS of the right breast.  We discussed local management options which include mastectomy versus partial mastectomy.  We discussed the role of radiation therapy in patients over age 21, and that radiation therapy can be omitted provided that one takes antiestrogen pill for 5 years.  There would be no survival benefit of radiation therapy in this setting.  However, there is approximately an 8% difference in local control (10% versus 2%) comparing antiestrogen therapy alone to antiestrogen therapy along with radiation therapy.  I believe that she has a least a 10 year life expectancy.  She is rather functional and young for her age, and I would be willing to  offer her short course/hypofractionated radiation therapy over 3 weeks if she wants to pursue radiation therapy for improved local control.  We discussed the potential acute and late toxicities of radiation therapy.  We can discuss things in more detail following her definitive surgery.   PLAN: As discussed above.  45 minutes was spent face-to-face with the patient, primarily counseling patient and coordinating her care.

## 2015-03-04 NOTE — Progress Notes (Signed)
Location of Breast Cancer: Right Upper, Inner Quadrant  Histology per Pathology Report: 02/20/15 Diagnosis Breast, right, needle core biopsy - INVASIVE DUCTAL CARCINOMA, SEE COMMENT. - DUCTAL CARCINOMA IN SITU   Receptor Status: ER(), PR (), Her2-neu () - Unknown at this time  Did patient present with symptoms (if so, please note symptoms) or was this found on screening mammography?:   Past/Anticipated interventions by surgeon, if any: Biospy of Right breast.  "Plans for a right breast radioactive seed localized lumpectomy and sentinel node mapping."  Past/Anticipated interventions by medical oncology, if any: Unknown  Lymphedema issues, if any:  None  Pain issues, if any: None  SAFETY ISSUES:  Prior radiation? No  Pacemaker/ICD? No  Possible current pregnancy?No  Is the patient on methotrexate? No  Current Complaints / other details:    Menarche age 67, Parity age 8 or 56, G1,P1, No BC, HRT less than 1 year Menopause early 59's    Danaija Eskridge, Crista Curb, RN 03/04/2015,10:21 AM

## 2015-03-05 ENCOUNTER — Telehealth: Payer: Self-pay | Admitting: *Deleted

## 2015-03-05 NOTE — Telephone Encounter (Signed)
Received referral from [facility].  Called pt and confirmed 03/11/15 appt w/ her.  Mailed before appt letter, calendar, welcoming packet & intake form to pt. Emailed Engineer, civil (consulting) at [employer] to make her aware.  Placed a copy of records in Dr. Geralyn Flash box and took one to HIM to scan.

## 2015-03-05 NOTE — Addendum Note (Signed)
Encounter addended by: Benn Moulder, RN on: 03/05/2015 11:38 AM<BR>     Documentation filed: BPA Follow-up Actions, Flowsheet VN, Dx Association, Orders

## 2015-03-05 NOTE — Addendum Note (Signed)
Encounter addended by: Benn Moulder, RN on: 03/05/2015  9:20 AM<BR>     Documentation filed: Charges VN

## 2015-03-11 ENCOUNTER — Other Ambulatory Visit: Payer: Self-pay | Admitting: General Surgery

## 2015-03-11 ENCOUNTER — Encounter: Payer: Self-pay | Admitting: *Deleted

## 2015-03-11 ENCOUNTER — Ambulatory Visit: Payer: Medicare Other

## 2015-03-11 ENCOUNTER — Encounter: Payer: Self-pay | Admitting: Hematology and Oncology

## 2015-03-11 ENCOUNTER — Ambulatory Visit (HOSPITAL_BASED_OUTPATIENT_CLINIC_OR_DEPARTMENT_OTHER): Payer: Medicare Other | Admitting: Hematology and Oncology

## 2015-03-11 VITALS — BP 143/68 | HR 76 | Temp 97.7°F | Resp 18 | Ht 67.0 in | Wt 191.5 lb

## 2015-03-11 DIAGNOSIS — C50211 Malignant neoplasm of upper-inner quadrant of right female breast: Secondary | ICD-10-CM

## 2015-03-11 DIAGNOSIS — Z170 Estrogen receptor positive status [ER+]: Secondary | ICD-10-CM | POA: Diagnosis not present

## 2015-03-11 NOTE — Progress Notes (Signed)
New patient intake form received from patient.  Chart updated.  Sent to scan.

## 2015-03-11 NOTE — Assessment & Plan Note (Signed)
Right breast biopsy: Invasive ductal carcinoma grade 1 with DCIS; ER 99%, PR 98%, Ki-67 12%, HER-2 negative ratio 1.71 Right breast MRI: 1.3 x 0.6 x 0.6 cm bilobed area of nodularity in the right breast, post biopsy change  Pathology and radiology counseling:Discussed with the patient, the details of pathology including the type of breast cancer,the clinical staging, the significance of ER, PR and HER-2/neu receptors and the implications for treatment. After reviewing the pathology in detail, we proceeded to discuss the different treatment options between surgery, radiation, chemotherapy, antiestrogen therapies.  Recommendation: 1. Breast conserving surgery followed by 2. Oncotype DX testing to determine benefit to chemotherapy followed by 3. Adjuvant radiation followed by 4. Adjuvant antiestrogen therapy  Oncotype DX counseling:I discussed Oncotype DX test. I explained to the patient that this is a 21 gene panel to evaluate patient tumors DNA to calculate recurrence score. This would help determine whether patient has high risk or intermediate risk or low risk breast cancer. She understands that if her tumor was found to be high risk, she would benefit from systemic chemotherapy. If low risk, no need of chemotherapy. If she was found to be intermediate risk, we would need to evaluate the score as well as other risk factors and determine if an abbreviated chemotherapy may be of benefit.  Return to clinic after surgery to discuss the results and to determine if Oncotype DX needs to be sent. At the outset her tumor appears to be very favorable prognostic profile being grade 1 with a low Ki-67.

## 2015-03-11 NOTE — Progress Notes (Signed)
[facility] NOTE  Patient Care Team: Mayra Neer, MD as PCP - General (Family Medicine)  CHIEF COMPLAINTS/PURPOSE OF CONSULTATION:  Newly diagnosed breast cancer  HISTORY OF PRESENTING ILLNESS:  [name] 74 y.o. female is here because of recent diagnosis of right breast cancer. Patient had a routine screening mammogram that revealed a slight abnormality which led to an ultrasound and a biopsy that came back as invasive ductal carcinoma with DCIS that was ER/PR positive HER-2 negative with a Ki-67 of 12%. She underwent a breast MRI on 02/25/2015 that revealed a 1.3 cm area of bilobed area of nodularity in the right breast with postbiopsy change. She is planning to undergo breast surgery next week. She has been sent to [place] to discuss adjuvant treatment options.  I reviewed her records extensively and collaborated the history with the patient.  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; ER 99%, PR 98%, Ki-67 12%, HER-2 negative ratio 1.71   02/25/2015 Breast MRI Right breast: 1.3 x 0.6 x 0.6 cm bilobed area of nodularity in the right breast, post biopsy change    In terms of breast cancer risk profile:  She menarched at early age of 58 and went to menopause at age 86  She had one pregnancy, her first child was born at age 74  She has not received birth control pills.  She was briefly exposed to hormone replacement therapy.  She has no family history of Breast/GYN/GI cancer  MEDICAL HISTORY:  Past Medical History  Diagnosis Date  . Hypothyroidism   . Arthritis   . Glaucoma   . Breast cancer     SURGICAL HISTORY: Past Surgical History  Procedure Laterality Date  . Abdominal hysterectomy    . Appendectomy    . Cholecystectomy    . Tubal ligation    . Tonsillectomy    . Anterior and posterior repair   06/10/2011    Procedure: ANTERIOR (CYSTOCELE) AND POSTERIOR REPAIR (RECTOCELE);  Surgeon: Blane Ohara Meisinger;  Location: [facility];  Service: Gynecology;  Laterality: N/A;  . Vaginal prolapse repair  06/10/2011    Procedure: VAGINAL VAULT SUSPENSION;  Surgeon: Blane Ohara Meisinger;  Location: [facility];  Service: Gynecology;  Laterality: N/A;  . Carpal tunnel release      LEFT   . Ankle surgery      RIGHT   . Hammer toe surgery      LEFT  . Knee arthroscopy      RIGHT  . Total knee arthroplasty Right 05/23/2013    Procedure: Right Total Knee Arthroplasty;  Surgeon: Newt Minion, MD;  Location: [facility];  Service: Orthopedics;  Laterality: Right;  Right Total Knee Arthroplasty    SOCIAL HISTORY: History   Social History  . Marital Status: Married    Spouse Name: N/A  . Number of Children: N/A  . Years of Education: N/A   Occupational History  . Not on file.   Social History Main Topics  . Smoking status: Never Smoker   . Smokeless tobacco: Not on file  . Alcohol Use: No  . Drug Use: No  . Sexual Activity: Not on file   Other Topics Concern  . Not on file   Social History Narrative    FAMILY HISTORY: Family History  Problem Relation Age of Onset  . Adopted: Yes  ALLERGIES:  has No Known Allergies.  MEDICATIONS:  Current Outpatient Prescriptions  Medication Sig Dispense Refill  . aspirin EC 81 MG tablet Take 1 tablet (81 mg total) by mouth daily. 30 tablet 1  . bimatoprost (LUMIGAN) 0.01 % SOLN 1 drop at bedtime.    . Calcium-Vitamin D (CALTRATE 600 PLUS-VIT D PO) Take 2 tablets by mouth every evening.     . dorzolamide-timolol (COSOPT) 22.3-6.8 MG/ML ophthalmic solution     . ezetimibe (ZETIA) 10 MG tablet Take 10 mg by mouth daily.    Marland Kitchen levothyroxine (SYNTHROID, LEVOTHROID) 112 MCG tablet Take 112 mcg by mouth daily.  11  . multivitamin (THERAGRAN) per tablet Take 1 tablet by mouth daily.     . Omega-3 Fatty Acids (FISH OIL) 1000 MG CAPS Take 2,000 capsules by mouth 2 (two)  times daily.    Marland Kitchen PROCTOFOAM HC rectal foam   1  . vitamin E 400 UNIT capsule Take 400 Units by mouth every morning.     No current facility-administered medications for this visit.    REVIEW OF SYSTEMS:   Constitutional: Denies fevers, chills or abnormal night sweats Eyes: Denies blurriness of vision, double vision or watery eyes Ears, nose, mouth, throat, and face: Denies mucositis or sore throat Respiratory: Denies cough, dyspnea or wheezes Cardiovascular: Denies palpitation, chest discomfort or lower extremity swelling Gastrointestinal:  Denies nausea, heartburn or change in bowel habits Skin: Denies abnormal skin rashes Lymphatics: Denies new lymphadenopathy or easy bruising Neurological:Denies numbness, tingling or new weaknesses Behavioral/Psych: Mood is stable, no new changes  Breast:  Denies any palpable lumps or discharge All other systems were reviewed with the patient and are negative.  PHYSICAL EXAMINATION: ECOG PERFORMANCE STATUS: 1 - Symptomatic but completely ambulatory  Filed Vitals:   03/11/15 1519  BP: 143/68  Pulse: 76  Temp: 97.7 F (36.5 C)  Resp: 18   Filed Weights   03/11/15 1519  Weight: 191 lb 8 oz (86.864 kg)    GENERAL:alert, no distress and comfortable SKIN: skin color, texture, turgor are normal, no rashes or significant lesions EYES: normal, conjunctiva are pink and non-injected, sclera clear OROPHARYNX:no exudate, no erythema and lips, buccal mucosa, and tongue normal  NECK: supple, thyroid normal size, non-tender, without nodularity LYMPH:  no palpable lymphadenopathy in the cervical, axillary or inguinal LUNGS: clear to auscultation and percussion with normal breathing effort HEART: regular rate & rhythm and no murmurs and no lower extremity edema ABDOMEN:abdomen soft, non-tender and normal bowel sounds Musculoskeletal:no cyanosis of digits and no clubbing  PSYCH: alert & oriented x 3 with fluent speech NEURO: no focal motor/sensory  deficits BREAST: No palpable nodules in breast. No palpable axillary or supraclavicular lymphadenopathy (exam performed in the presence of a chaperone)   LABORATORY DATA:  I have reviewed the data as listed Lab Results  Component Value Date   WBC 15.4* 06/06/2013   HGB 10.8* 06/06/2013   HCT 32.1* 06/06/2013   MCV 94.1 06/06/2013   PLT 538* 06/06/2013   Lab Results  Component Value Date   NA 139 05/24/2013   K 3.6 05/24/2013   CL 105 05/24/2013   CO2 27 05/24/2013    RADIOGRAPHIC STUDIES: I have personally reviewed the radiological reports and agreed with the findings in the report. Results summarized as above  ASSESSMENT AND PLAN:  Breast cancer of upper-inner quadrant of right female breast Right breast biopsy: Invasive ductal carcinoma grade 1 with DCIS; ER 99%, PR 98%, Ki-67 12%, HER-2 negative ratio 1.71  Right breast MRI: 1.3 x 0.6 x 0.6 cm bilobed area of nodularity in the right breast, post biopsy change  Pathology and radiology counseling:Discussed with the patient, the details of pathology including the type of breast cancer,the clinical staging, the significance of ER, PR and HER-2/neu receptors and the implications for treatment. After reviewing the pathology in detail, we proceeded to discuss the different treatment options between surgery, radiation, chemotherapy, antiestrogen therapies.  Recommendation: 1. Breast conserving surgery followed by 2. Oncotype DX testing to determine benefit to chemotherapy followed by 3. Adjuvant radiation followed by 4. Adjuvant antiestrogen therapy  Oncotype DX counseling:I discussed Oncotype DX test. I explained to the patient that this is a 21 gene panel to evaluate patient tumors DNA to calculate recurrence score. This would help determine whether patient has high risk or intermediate risk or low risk breast cancer. She understands that if her tumor was found to be high risk, she would benefit from systemic chemotherapy. If low  risk, no need of chemotherapy. If she was found to be intermediate risk, we would need to evaluate the score as well as other risk factors and determine if an abbreviated chemotherapy may be of benefit.  Return to clinic after surgery to discuss the results and to determine if Oncotype DX needs to be sent. At the outset her tumor appears to be very favorable prognostic profile being grade 1 with a low Ki-67.      All questions were answered. The patient knows to call the clinic with any problems, questions or concerns.    Rulon Eisenmenger, MD 3:58 PM

## 2015-03-11 NOTE — Progress Notes (Signed)
Checked in new pt with no financial concerns prior to seeing the dr.  Pt has my card for any billing questions or concerns. ° °

## 2015-03-11 NOTE — Progress Notes (Signed)
Met with pt during new pt visit with Dr. Lindi Adie. Gave navigation resources, alight bag/journal and contact information. Discussed care plan summary. Pt denies further questions at this time. Encourage pt to call with needs or concerns. Received verbal understanding. POF placed for f/u 1 wk post op.

## 2015-03-12 ENCOUNTER — Telehealth: Payer: Self-pay | Admitting: Hematology and Oncology

## 2015-03-12 NOTE — Telephone Encounter (Signed)
Spoke with patient and she is aware of her appointment °

## 2015-03-12 NOTE — Progress Notes (Addendum)
Md note crated during office visit sent to scan.  Copy to patient.  [facility] office notes dtd 02/28/15 Reviewed by Dr. Lindi Adie, Sent to scan.

## 2015-03-14 ENCOUNTER — Encounter: Payer: Self-pay | Admitting: *Deleted

## 2015-03-14 ENCOUNTER — Encounter (HOSPITAL_BASED_OUTPATIENT_CLINIC_OR_DEPARTMENT_OTHER): Payer: Self-pay | Admitting: *Deleted

## 2015-03-14 NOTE — Progress Notes (Signed)
[facility] Psychosocial Distress Screening Clinical Social Work  Clinical Social Work was referred by distress screening protocol.  The patient scored a 6 on the Psychosocial Distress Thermometer which indicates moderate distress. Clinical Social Worker phoned pt to assess for distress and other psychosocial needs. Pt reports to be doing well, but is eager to "get her surgery over with". She denied current concerns or issues. CSW explained role of Pt and Family Support Team and programs. Pt not open to [employer] currently. Pt aware to reach out as needed and has contact information.  [facility] DISTRESS SCREENING 03/04/2015  Screening Type Initial Screening  Distress experienced in past week (1-10) 6  Emotional problem type Adjusting to illness  Referral to clinical social work Yes     Clinical Social Worker follow up needed: No.  If yes, follow up plan: Loren Racer, [place]  [facility] Phone: [phone] Fax: [phone]

## 2015-03-17 ENCOUNTER — Ambulatory Visit
Admission: RE | Admit: 2015-03-17 | Discharge: 2015-03-17 | Disposition: A | Discharge status: Home / Self Care | Payer: Medicare Other | Source: Ambulatory Visit | Attending: General Surgery | Admitting: General Surgery

## 2015-03-17 DIAGNOSIS — C50211 Malignant neoplasm of upper-inner quadrant of right female breast: Secondary | ICD-10-CM

## 2015-03-19 ENCOUNTER — Ambulatory Visit (HOSPITAL_BASED_OUTPATIENT_CLINIC_OR_DEPARTMENT_OTHER)
Admission: RE | Admit: 2015-03-19 | Discharge: 2015-03-19 | Disposition: A | Discharge status: Home / Self Care | Payer: Medicare Other | Source: Ambulatory Visit | Attending: General Surgery | Admitting: General Surgery

## 2015-03-19 ENCOUNTER — Encounter (HOSPITAL_BASED_OUTPATIENT_CLINIC_OR_DEPARTMENT_OTHER): Payer: Self-pay | Admitting: Anesthesiology

## 2015-03-19 ENCOUNTER — Encounter (HOSPITAL_COMMUNITY)
Admission: RE | Admit: 2015-03-19 | Discharge: 2015-03-19 | Disposition: A | Discharge status: Home / Self Care | Payer: Medicare Other | Source: Ambulatory Visit | Attending: General Surgery | Admitting: General Surgery

## 2015-03-19 ENCOUNTER — Ambulatory Visit (HOSPITAL_BASED_OUTPATIENT_CLINIC_OR_DEPARTMENT_OTHER): Payer: Medicare Other | Admitting: Anesthesiology

## 2015-03-19 ENCOUNTER — Ambulatory Visit
Admission: RE | Admit: 2015-03-19 | Discharge: 2015-03-19 | Disposition: A | Discharge status: Home / Self Care | Payer: Medicare Other | Source: Ambulatory Visit | Attending: General Surgery | Admitting: General Surgery

## 2015-03-19 ENCOUNTER — Encounter (HOSPITAL_BASED_OUTPATIENT_CLINIC_OR_DEPARTMENT_OTHER)
Admission: RE | Disposition: A | Discharge status: Home / Self Care | Payer: Self-pay | Source: Ambulatory Visit | Attending: General Surgery

## 2015-03-19 DIAGNOSIS — C773 Secondary and unspecified malignant neoplasm of axilla and upper limb lymph nodes: Secondary | ICD-10-CM | POA: Insufficient documentation

## 2015-03-19 DIAGNOSIS — C50911 Malignant neoplasm of unspecified site of right female breast: Secondary | ICD-10-CM | POA: Insufficient documentation

## 2015-03-19 DIAGNOSIS — Z79899 Other long term (current) drug therapy: Secondary | ICD-10-CM | POA: Diagnosis not present

## 2015-03-19 DIAGNOSIS — E039 Hypothyroidism, unspecified: Secondary | ICD-10-CM | POA: Insufficient documentation

## 2015-03-19 DIAGNOSIS — C50211 Malignant neoplasm of upper-inner quadrant of right female breast: Secondary | ICD-10-CM

## 2015-03-19 DIAGNOSIS — M1990 Unspecified osteoarthritis, unspecified site: Secondary | ICD-10-CM | POA: Diagnosis not present

## 2015-03-19 DIAGNOSIS — Z7982 Long term (current) use of aspirin: Secondary | ICD-10-CM | POA: Insufficient documentation

## 2015-03-19 HISTORY — PX: RADIOACTIVE SEED GUIDED PARTIAL MASTECTOMY WITH AXILLARY SENTINEL LYMPH NODE BIOPSY: SHX6520

## 2015-03-19 HISTORY — PX: BREAST SURGERY: SHX581

## 2015-03-19 LAB — POCT HEMOGLOBIN-HEMACUE: Hemoglobin: 13.8 g/dL (ref 12.0–15.0)

## 2015-03-19 SURGERY — RADIOACTIVE SEED GUIDED PARTIAL MASTECTOMY WITH AXILLARY SENTINEL LYMPH NODE BIOPSY
Anesthesia: General | Site: Breast | Laterality: Right

## 2015-03-19 MED ORDER — CHLORHEXIDINE GLUCONATE 4 % EX LIQD: 1.0000 "application " | Freq: Once | CUTANEOUS | Status: DC

## 2015-03-19 MED ORDER — FENTANYL CITRATE (PF) 100 MCG/2ML IJ SOLN
INTRAMUSCULAR | Status: AC
  Filled 2015-03-19: qty 2

## 2015-03-19 MED ORDER — FENTANYL CITRATE (PF) 100 MCG/2ML IJ SOLN
INTRAMUSCULAR | Status: AC
  Filled 2015-03-19: qty 4

## 2015-03-19 MED ORDER — PROPOFOL 10 MG/ML IV BOLUS
INTRAVENOUS | Status: DC | PRN
  Administered 2015-03-19: 150 mg via INTRAVENOUS

## 2015-03-19 MED ORDER — PROMETHAZINE HCL 25 MG/ML IJ SOLN: 6.2500 mg | INTRAMUSCULAR | Status: DC | PRN

## 2015-03-19 MED ORDER — SODIUM CHLORIDE 0.9 % IJ SOLN
INTRAMUSCULAR | Status: AC
  Filled 2015-03-19: qty 10

## 2015-03-19 MED ORDER — TECHNETIUM TC 99M SULFUR COLLOID FILTERED: 1.0000 | Freq: Once | INTRAVENOUS | Status: AC | PRN

## 2015-03-19 MED ORDER — MIDAZOLAM HCL 2 MG/2ML IJ SOLN
INTRAMUSCULAR | Status: AC
  Filled 2015-03-19: qty 2

## 2015-03-19 MED ORDER — LACTATED RINGERS IV SOLN
INTRAVENOUS | Status: DC
  Administered 2015-03-19: 10:00:00 via INTRAVENOUS

## 2015-03-19 MED ORDER — GLYCOPYRROLATE 0.2 MG/ML IJ SOLN: 0.2000 mg | Freq: Once | INTRAMUSCULAR | Status: DC | PRN

## 2015-03-19 MED ORDER — FENTANYL CITRATE (PF) 100 MCG/2ML IJ SOLN
25.0000 ug | INTRAMUSCULAR | Status: DC | PRN
  Administered 2015-03-19 (×2): 25 ug via INTRAVENOUS
  Administered 2015-03-19: 50 ug via INTRAVENOUS
  Administered 2015-03-19: 25 ug via INTRAVENOUS

## 2015-03-19 MED ORDER — METHYLENE BLUE 1 % INJ SOLN
INTRAMUSCULAR | Status: AC
Start: 2015-03-19 — End: 2015-03-19
  Filled 2015-03-19: qty 10

## 2015-03-19 MED ORDER — BUPIVACAINE HCL (PF) 0.25 % IJ SOLN
INTRAMUSCULAR | Status: DC | PRN
  Administered 2015-03-19: 20 mL

## 2015-03-19 MED ORDER — BUPIVACAINE HCL (PF) 0.25 % IJ SOLN
INTRAMUSCULAR | Status: AC
  Filled 2015-03-19: qty 60

## 2015-03-19 MED ORDER — KETOROLAC TROMETHAMINE 30 MG/ML IJ SOLN: 30.0000 mg | Freq: Once | INTRAMUSCULAR | Status: DC | PRN

## 2015-03-19 MED ORDER — LIDOCAINE HCL (CARDIAC) 20 MG/ML IV SOLN
INTRAVENOUS | Status: DC | PRN
  Administered 2015-03-19: 50 mg via INTRAVENOUS

## 2015-03-19 MED ORDER — CEFAZOLIN SODIUM-DEXTROSE 2-3 GM-% IV SOLR
2.0000 g | INTRAVENOUS | Status: AC
  Administered 2015-03-19: 2 g via INTRAVENOUS

## 2015-03-19 MED ORDER — MIDAZOLAM HCL 2 MG/2ML IJ SOLN
1.0000 mg | INTRAMUSCULAR | Status: DC | PRN
Start: 2015-03-19 — End: 2015-03-19
  Administered 2015-03-19 (×2): 1 mg via INTRAVENOUS

## 2015-03-19 MED ORDER — FENTANYL CITRATE (PF) 100 MCG/2ML IJ SOLN
50.0000 ug | INTRAMUSCULAR | Status: DC | PRN
  Administered 2015-03-19 (×2): 50 ug via INTRAVENOUS

## 2015-03-19 MED ORDER — CEFAZOLIN SODIUM-DEXTROSE 2-3 GM-% IV SOLR
INTRAVENOUS | Status: AC
  Filled 2015-03-19: qty 50

## 2015-03-19 MED ORDER — ONDANSETRON HCL 4 MG/2ML IJ SOLN
INTRAMUSCULAR | Status: DC | PRN
  Administered 2015-03-19: 4 mg via INTRAVENOUS

## 2015-03-19 MED ORDER — OXYCODONE-ACETAMINOPHEN 5-325 MG PO TABS: 1.0000 | ORAL_TABLET | ORAL | Status: DC | PRN

## 2015-03-19 SURGICAL SUPPLY — 46 items
APPLIER CLIP 9.375 MED OPEN (MISCELLANEOUS) ×3
APR CLP MED 9.3 20 MLT OPN (MISCELLANEOUS) ×1
BLADE SURG 15 STRL LF DISP TIS (BLADE) ×1 IMPLANT
BLADE SURG 15 STRL SS (BLADE) ×3
CANISTER SUC SOCK COL 7IN (MISCELLANEOUS) IMPLANT
CANISTER SUCT 1200ML W/VALVE (MISCELLANEOUS) ×3 IMPLANT
CHLORAPREP W/TINT 26ML (MISCELLANEOUS) ×3 IMPLANT
CLIP APPLIE 9.375 MED OPEN (MISCELLANEOUS) ×1 IMPLANT
COVER BACK TABLE 60X90IN (DRAPES) ×3 IMPLANT
COVER MAYO STAND STRL (DRAPES) ×3 IMPLANT
COVER PROBE W GEL 5X96 (DRAPES) ×3 IMPLANT
DECANTER SPIKE VIAL GLASS SM (MISCELLANEOUS) IMPLANT
DEVICE DUBIN W/COMP PLATE 8390 (MISCELLANEOUS) ×3 IMPLANT
DRAPE LAPAROSCOPIC ABDOMINAL (DRAPES) ×3 IMPLANT
DRAPE UTILITY XL STRL (DRAPES) ×3 IMPLANT
ELECT COATED BLADE 2.86 ST (ELECTRODE) ×3 IMPLANT
ELECT REM PT RETURN 9FT ADLT (ELECTROSURGICAL) ×3
ELECTRODE REM PT RTRN 9FT ADLT (ELECTROSURGICAL) ×1 IMPLANT
GLOVE BIO SURGEON STRL SZ 6.5 (GLOVE) ×2 IMPLANT
GLOVE BIO SURGEON STRL SZ7.5 (GLOVE) ×3 IMPLANT
GLOVE BIO SURGEONS STRL SZ 6.5 (GLOVE) ×1
GLOVE BIOGEL PI IND STRL 7.0 (GLOVE) IMPLANT
GLOVE BIOGEL PI INDICATOR 7.0 (GLOVE) ×2
GLOVE EXAM NITRILE MD LF STRL (GLOVE) ×2 IMPLANT
GOWN STRL REUS W/ TWL LRG LVL3 (GOWN DISPOSABLE) ×2 IMPLANT
GOWN STRL REUS W/TWL LRG LVL3 (GOWN DISPOSABLE) ×6
KIT MARKER MARGIN INK (KITS) ×3 IMPLANT
LIQUID BAND (GAUZE/BANDAGES/DRESSINGS) ×3 IMPLANT
NDL HYPO 25X1 1.5 SAFETY (NEEDLE) ×1 IMPLANT
NDL SAFETY ECLIPSE 18X1.5 (NEEDLE) IMPLANT
NEEDLE HYPO 18GX1.5 SHARP (NEEDLE)
NEEDLE HYPO 25X1 1.5 SAFETY (NEEDLE) ×3 IMPLANT
NS IRRIG 1000ML POUR BTL (IV SOLUTION) IMPLANT
PACK BASIN DAY SURGERY FS (CUSTOM PROCEDURE TRAY) ×3 IMPLANT
PENCIL BUTTON HOLSTER BLD 10FT (ELECTRODE) ×3 IMPLANT
SLEEVE SCD COMPRESS KNEE MED (MISCELLANEOUS) ×3 IMPLANT
SPONGE LAP 18X18 X RAY DECT (DISPOSABLE) ×3 IMPLANT
SUT MON AB 4-0 PC3 18 (SUTURE) ×6 IMPLANT
SUT SILK 2 0 SH (SUTURE) IMPLANT
SUT VICRYL 3-0 CR8 SH (SUTURE) ×3 IMPLANT
SYR CONTROL 10ML LL (SYRINGE) ×3 IMPLANT
TOWEL OR 17X24 6PK STRL BLUE (TOWEL DISPOSABLE) ×3 IMPLANT
TOWEL OR NON WOVEN STRL DISP B (DISPOSABLE) ×3 IMPLANT
TUBE CONNECTING 20'X1/4 (TUBING) ×1
TUBE CONNECTING 20X1/4 (TUBING) ×1 IMPLANT
YANKAUER SUCT BULB TIP NO VENT (SUCTIONS) ×2 IMPLANT

## 2015-03-19 NOTE — Anesthesia Procedure Notes (Signed)
Procedure Name: LMA Insertion Date/Time: 03/19/2015 10:18 AM Performed by: Lieutenant Diego Pre-anesthesia Checklist: Patient identified, Emergency Drugs available, Suction available and Patient being monitored Patient Re-evaluated:Patient Re-evaluated prior to inductionOxygen Delivery Method: Circle System Utilized Preoxygenation: Pre-oxygenation with 100% oxygen Intubation Type: IV induction Ventilation: Mask ventilation without difficulty LMA: LMA inserted LMA Size: 4.0 Number of attempts: 1 Airway Equipment and Method: Bite block Placement Confirmation: positive ETCO2 and breath sounds checked- equal and bilateral Tube secured with: Tape Dental Injury: Teeth and Oropharynx as per pre-operative assessment

## 2015-03-19 NOTE — Transfer of Care (Signed)
Immediate Anesthesia Transfer of Care Note  Patient: [name]  Procedure(s) Performed: Procedure(s): RIGHT BREAST LUMPECTOMY WITH RADIOACTIVE SEED AND SENTINEL LYMPH NODE MAPPING (Right)  Patient Location: PACU  Anesthesia Type:General  Level of Consciousness: sedated  Airway & Oxygen Therapy: Patient Spontanous Breathing and Patient connected to face mask oxygen  Post-op Assessment: Report given to RN and Post -op Vital signs reviewed and stable  Post vital signs: Reviewed and stable  Last Vitals:  Filed Vitals:   03/19/15 1002  BP: 144/66  Pulse: 72  Temp:   Resp: 14    Complications: No apparent anesthesia complications

## 2015-03-19 NOTE — H&P (Signed)
[name] 02/28/2015 11:47 AM Location: [facility] Patient #: [id number] DOB: 1941/03/10 Married / Language: [name] / Race: White Female  History of Present Illness [name]. Marlou Starks MD; 02/28/2015 1:36 PM) Patient words: breast check.  The patient is a 74 year old female who presents with breast cancer. We are asked to see the patient in consultation by Dr. Derrel Nip to evaluate her for a new right breast cancer. The patient is a 74 year old white female who recently went for a routine screening mammogram. At that time she was found to have an abnormality in the upper inner quadrant of the right breast. 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 Her tumor markers have not been reported yet. She has no family history that she knows of since she is adopted. She does not take hormone replacement. She denies any breast pain or discharge from the nipple. The MRI study estimated the size of the cancer to be 1.3 cm   Other Problems [name], [name]; 02/28/2015 11:47 AM) Arthritis Breast Cancer Hemorrhoids Thyroid Disease  Past Surgical History [name], [place]; 02/28/2015 11:47 AM) Appendectomy Foot Surgery Right. Gallbladder Surgery - Open Knee Surgery Right. Tonsillectomy  Diagnostic Studies History [name], [name]; 02/28/2015 11:47 AM) Colonoscopy 1-5 years ago Mammogram within last year  Allergies [name], [place]; 02/28/2015 11:48 AM) No Known Drug Allergies05/03/2015  Medication History ([name], [name]; 02/28/2015 11:49 AM) Levothyroxine Sodium (112MCG Tablet, Oral) Active. Aspirin EC (81MG  Tablet DR, Oral) Active. Fish Oil Active. Lumigan (0.01% Solution, Ophthalmic) Active. Zetia (10MG  Tablet, Oral) Active. Vitamin E Complex (1000UNIT Capsule, Oral) Active. Medications Reconciled  Social History [name], [name]; 02/28/2015 11:47 AM) No alcohol use Tobacco use Never smoker.  Pregnancy / Birth History  [name], [place]; 02/28/2015 11:47 AM) Age at menarche 29 years. Age of menopause <45 Gravida 1 Maternal age 83-35 Para 1  Review of Systems ([place]; 02/28/2015 11:47 AM) General Not Present- Appetite Loss, Chills, Fatigue, Fever, Night Sweats, Weight Gain and Weight Loss. Skin Not Present- Change in Wart/Mole, Dryness, Hives, Jaundice, New Lesions, Non-Healing Wounds, Rash and Ulcer. HEENT Present- Hearing Loss and Wears glasses/contact lenses. Not Present- Earache, Hoarseness, Nose Bleed, Oral Ulcers, Ringing in the Ears, Seasonal Allergies, Sinus Pain, Sore Throat, Visual Disturbances and Yellow Eyes. Respiratory Not Present- Bloody sputum, Chronic Cough, Difficulty Breathing, Snoring and Wheezing. Cardiovascular Not Present- Chest Pain, Difficulty Breathing Lying Down, Leg Cramps, Palpitations, Rapid Heart Rate, Shortness of Breath and Swelling of Extremities. Gastrointestinal Not Present- Abdominal Pain, Bloating, Bloody Stool, Change in Bowel Habits, Chronic diarrhea, Constipation, Difficulty Swallowing, Excessive gas, Gets full quickly at meals, Hemorrhoids, Indigestion, Nausea, Rectal Pain and Vomiting. Female Genitourinary Not Present- Frequency, Nocturia, Painful Urination, Pelvic Pain and Urgency. Musculoskeletal Present- Joint Stiffness. Not Present- Back Pain, Joint Pain, Muscle Pain, Muscle Weakness and Swelling of Extremities. Neurological Not Present- Decreased Memory, Fainting, Headaches, Numbness, Seizures, Tingling, Tremor, Trouble walking and Weakness. Psychiatric Not Present- Anxiety, Bipolar, Change in Sleep Pattern, Depression, Fearful and Frequent crying. Endocrine Not Present- Cold Intolerance, Excessive Hunger, Hair Changes, Heat Intolerance, Hot flashes and New Diabetes. Hematology Not Present- Easy Bruising, Excessive bleeding, Gland problems, HIV and Persistent Infections.   Vitals ([name] [name]; 02/28/2015 11:48 AM) 02/28/2015 11:48 AM Weight: 190 lb  Height: 67in Body Surface Area: 2.02 m Body Mass Index: 29.76 kg/m Temp.: 57F(Temporal)  Pulse: 72 (Regular)  BP: 122/80 (Sitting, Left Arm, Standard)    Physical Exam [name] S.  Marlou Starks MD; 02/28/2015 1:37 PM) General Mental Status-Alert. General Appearance-Consistent with stated age. Hydration-Well hydrated. Voice-Normal.  Head and Neck Head-normocephalic, atraumatic with no lesions or palpable masses. Trachea-midline. Thyroid Gland Characteristics - normal size and consistency.  Eye Eyeball - Bilateral-Extraocular movements intact. Sclera/Conjunctiva - Bilateral-No scleral icterus.  Chest and Lung Exam Chest and lung exam reveals -quiet, even and easy respiratory effort with no use of accessory muscles and on auscultation, normal breath sounds, no adventitious sounds and normal vocal resonance. Inspection Chest Wall - Normal. Back - normal.  Breast Note: There is a palpable bruise measuring about 2 cm in the 12 o'clock position of the right breast. There is no palpable mass in the left breast. There is no palpable axillary, supraclavicular, or cervical lymphadenopathy.   Cardiovascular Cardiovascular examination reveals -normal heart sounds, regular rate and rhythm with no murmurs and normal pedal pulses bilaterally.  Abdomen Inspection Inspection of the abdomen reveals - No Hernias. Skin - Scar - no surgical scars. Palpation/Percussion Palpation and Percussion of the abdomen reveal - Soft, Non Tender, No Rebound tenderness, No Rigidity (guarding) and No hepatosplenomegaly. Auscultation Auscultation of the abdomen reveals - Bowel sounds normal.  Neurologic Neurologic evaluation reveals -alert and oriented x 3 with no impairment of recent or remote memory. Mental Status-Normal.  Musculoskeletal Normal Exam - Left-Upper Extremity Strength Normal and Lower Extremity Strength Normal. Normal Exam - Right-Upper Extremity Strength  Normal and Lower Extremity Strength Normal.  Lymphatic Head & Neck  General Head & Neck Lymphatics: Bilateral - Description - Normal. Axillary  General Axillary Region: Bilateral - Description - Normal. Tenderness - Non Tender. Femoral & Inguinal  Generalized Femoral & Inguinal Lymphatics: Bilateral - Description - Normal. Tenderness - Non Tender.    Assessment & Plan [name] S. Marlou Starks MD; 02/28/2015 12:20 PM) PRIMARY CANCER OF UPPER INNER QUADRANT OF RIGHT FEMALE BREAST (174.2  C50.211) Impression: The patient appears to have a small stage I cancer in the upper inner right breast. Clinically her lymph nodes are negative. I have talked to her in detail about the different options for treatment at this point she favors breast conservation. I think this is a very reasonable way to treat her breast cancer. I will plan for a right breast radioactive seed localized lumpectomy and sentinel node mapping. We will also follow-up on her tumor markers. She may need to see medical oncology prior to surgery to talk about adjuvant treatment and possible Oncotype testing     Signed by Luella Cook, MD (02/28/2015 1:37 PM)

## 2015-03-19 NOTE — Anesthesia Postprocedure Evaluation (Signed)
  Anesthesia Post-op Note  Patient: [name]  Procedure(s) Performed: Procedure(s) (LRB): RIGHT BREAST LUMPECTOMY WITH RADIOACTIVE SEED AND SENTINEL LYMPH NODE MAPPING (Right)  Patient Location: PACU  Anesthesia Type: General  Level of Consciousness: awake and alert   Airway and Oxygen Therapy: Patient Spontanous Breathing  Post-op Pain: mild  Post-op Assessment: Post-op Vital signs reviewed, Patient's Cardiovascular Status Stable, Respiratory Function Stable, Patent Airway and No signs of Nausea or vomiting  Last Vitals:  Filed Vitals:   03/19/15 1345  BP: 156/67  Pulse: 56  Temp: 36.6 C  Resp: 18    Post-op Vital Signs: stable   Complications: No apparent anesthesia complications

## 2015-03-19 NOTE — Discharge Instructions (Signed)

## 2015-03-19 NOTE — Anesthesia Preprocedure Evaluation (Signed)
Anesthesia Evaluation  Patient identified by MRN, date of birth, ID band Patient awake    Reviewed: Allergy & Precautions, NPO status , Patient's Chart, lab work & pertinent test results  Airway Mallampati: II  TM Distance: >3 FB Neck ROM: Full    Dental no notable dental hx.    Pulmonary neg pulmonary ROS,  breath sounds clear to auscultation  Pulmonary exam normal       Cardiovascular negative cardio ROS Normal cardiovascular examRhythm:Regular Rate:Normal     Neuro/Psych negative neurological ROS  negative psych ROS   GI/Hepatic negative GI ROS, Neg liver ROS,   Endo/Other  Hypothyroidism   Renal/GU negative Renal ROS  negative genitourinary   Musculoskeletal negative musculoskeletal ROS (+)   Abdominal   Peds negative pediatric ROS (+)  Hematology negative hematology ROS (+)   Anesthesia Other Findings   Reproductive/Obstetrics negative OB ROS                             Anesthesia Physical Anesthesia Plan  ASA: II  Anesthesia Plan: General   Post-op Pain Management:    Induction: Intravenous  Airway Management Planned: LMA  Additional Equipment:   Intra-op Plan:   Post-operative Plan: Extubation in OR  Informed Consent: I have reviewed the patients History and Physical, chart, labs and discussed the procedure including the risks, benefits and alternatives for the proposed anesthesia with the patient or authorized representative who has indicated his/her understanding and acceptance.   Dental advisory given  Plan Discussed with: CRNA and Surgeon  Anesthesia Plan Comments:         Anesthesia Quick Evaluation

## 2015-03-19 NOTE — Progress Notes (Signed)
Assisted Dr. Rose with right, ultrasound guided, pectoralis block. Side rails up, monitors on throughout procedure. See vital signs in flow sheet. Tolerated Procedure well. 

## 2015-03-19 NOTE — Op Note (Signed)
03/19/2015  11:37 AM  PATIENT:  [name]  74 y.o. female  PRE-OPERATIVE DIAGNOSIS:  Right Breast Cancer  POST-OPERATIVE DIAGNOSIS:  Right Breast Cancer  PROCEDURE:  Procedure(s): RIGHT BREAST LUMPECTOMY WITH RADIOACTIVE SEED AND SENTINEL LYMPH NODE MAPPING (Right)  SURGEON:  Surgeon(s) and Role:    * Jovita Kussmaul, MD - Primary  PHYSICIAN ASSISTANT:   ASSISTANTS: none   ANESTHESIA:   general  EBL:  Total I/O In: 1000 [I.V.:1000] Out: -   BLOOD ADMINISTERED:none  DRAINS: none   LOCAL MEDICATIONS USED:  MARCAINE     SPECIMEN:  Source of Specimen:  right breast tissue and sentinel node  DISPOSITION OF SPECIMEN:  PATHOLOGY  COUNTS:  YES  TOURNIQUET:  * No tourniquets in log *  DICTATION: .Dragon Dictation  After informed consent was obtained patient was brought to the operating room and placed in the supine position on the operating room table. After adequate induction of general anesthesia the patient's right chest, breast, and axillary area were prepped with ChloraPrep, allowed to dry, and draped in usual sterile manner. Earlier in the day the patient underwent injection of 1 mCi of technetium sulfur colloid in the subareolar position on the right. Previously a I-125 radioactive seed was placed in the upper inner quadrant of the right breast to mark an area of breast cancer. With the neoprobe set to technetium we were able to identify a hot spot in the right axilla. A small transversely oriented incision was made with a 15 blade knife overlying the hot spot. The incision was carried through the skin and subcutaneous tissue sharply with electrocautery until the axilla was entered. A [place] retractor was deployed. Using the neoprobe to direct blunt hemostat dissection we were able to identify the hot lymph node. The lymph node was excised sharply with the electrocautery and the lymphatics were controlled with clips. Ex vivo counts on this node were approximately 4000.  There were no other hot or palpable lymph nodes in the right axilla. The wound was infiltrated with quarter percent Marcaine. The deep layer of the wound was closed with interrupted 3-0 Vicryl stitches. The skin was closed with a running 4-0 Monocryl subcuticular stitch. Attention was then turned to the right breast. The neoprobe set to I-125. The area of radioactivity was identified. A transversely oriented curvilinear incision was made overlying the area of radioactivity with a 15 blade knife. The incision was carried through the skin and subcutaneous tissue sharply with electrocautery. Once into the breast tissue a circular portion of breast tissue was excised sharply around the area of radioactivity while we checked the area of radioactivity frequently. Once the specimen was removed it was oriented with the appropriate paint colors. The I-125 seed was confirmed in the specimen. There was no residual I-125 radioactivity in the right breast. A specimen radiograph showed the clip in seed to be in the center of the specimen. The specimen was then sent to pathology for further evaluation. Hemostasis was achieved using the Bovie electrocautery. The wound was irrigated with copious amounts of saline and infiltrated with Marcaine. The cavity was marked with clips. The deep layer of the wound was then closed in layers of 3-0 Vicryl stitches. Skin was then closed with interrupted 4-0 Monocryl subcuticular stitches. Dermabond dressings were applied. The patient tolerated the procedure well. At the end of the case all needle sponge and instrument counts were correct. The patient was then awakened and taken to recovery in stable condition.  PLAN OF  CARE: Discharge to home after PACU  PATIENT DISPOSITION:  PACU - hemodynamically stable.   Delay start of Pharmacological VTE agent (>24hrs) due to surgical blood loss or risk of bleeding: not applicable

## 2015-03-19 NOTE — Interval H&P Note (Signed)
History and Physical Interval Note:  03/19/2015 9:20 AM  [name]  has presented today for surgery, with the diagnosis of Right Breast Cancer  The various methods of treatment have been discussed with the patient and family. After consideration of risks, benefits and other options for treatment, the patient has consented to  Procedure(s): RIGHT BREAST LUMPECTOMY WITH RADIOACTIVE SEED AND SENTINEL LYMPH NODE MAPPING (Right) as a surgical intervention .  The patient's history has been reviewed, patient examined, no change in status, stable for surgery.  I have reviewed the patient's chart and labs.  Questions were answered to the patient's satisfaction.     TOTH III,Ry Moody S

## 2015-03-20 ENCOUNTER — Encounter (HOSPITAL_BASED_OUTPATIENT_CLINIC_OR_DEPARTMENT_OTHER): Payer: Self-pay | Admitting: General Surgery

## 2015-04-01 ENCOUNTER — Ambulatory Visit (HOSPITAL_BASED_OUTPATIENT_CLINIC_OR_DEPARTMENT_OTHER): Payer: Medicare Other | Admitting: Hematology and Oncology

## 2015-04-01 ENCOUNTER — Encounter: Payer: Self-pay | Admitting: *Deleted

## 2015-04-01 ENCOUNTER — Telehealth: Payer: Self-pay | Admitting: Hematology and Oncology

## 2015-04-01 VITALS — BP 153/63 | HR 71 | Temp 97.8°F | Resp 18 | Ht 67.0 in | Wt 192.3 lb

## 2015-04-01 DIAGNOSIS — C50211 Malignant neoplasm of upper-inner quadrant of right female breast: Secondary | ICD-10-CM | POA: Diagnosis not present

## 2015-04-01 DIAGNOSIS — Z170 Estrogen receptor positive status [ER+]: Secondary | ICD-10-CM

## 2015-04-01 DIAGNOSIS — C779 Secondary and unspecified malignant neoplasm of lymph node, unspecified: Secondary | ICD-10-CM | POA: Diagnosis not present

## 2015-04-01 NOTE — Progress Notes (Signed)
Patient Care Team: Mayra Neer, MD as PCP - General (Family Medicine)  DIAGNOSIS: No matching staging information was found for the patient.  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; ER 99%, PR 98%, Ki-67 12%, HER-2 negative ratio 1.71   02/25/2015 Breast MRI Right breast: 1.3 x 0.6 x 0.6 cm bilobed area of nodularity in the right breast, post biopsy change   03/19/2015 Surgery Right lumpectomy: IDC 1.7 cm with DCIS, micromet in one lymph node measuring 2 mm negative for ECS, other lymph node negative, T1cN1a stage II a, ER 99%, PR 98%, HER-2 negative, Ki-67 12%    CHIEF COMPLIANT: Follow-up to discuss final pathology  INTERVAL HISTORY: [name] is a 73 year old with above-mentioned history of right breast cancer treated with lumpectomy she is here today to discuss the pathology report. The final pathology showed that she had a 1.7 cm tumor with 2 mm metastases in regional lymph node. This suggests stage II disease. She is healed and recovered well from the surgery.  REVIEW OF SYSTEMS:   Constitutional: Denies fevers, chills or abnormal weight loss Eyes: Denies blurriness of vision Ears, nose, mouth, throat, and face: Denies mucositis or sore throat Respiratory: Denies cough, dyspnea or wheezes Cardiovascular: Denies palpitation, chest discomfort or lower extremity swelling Gastrointestinal:  Denies nausea, heartburn or change in bowel habits Skin: Denies abnormal skin rashes Lymphatics: Denies new lymphadenopathy or easy bruising Neurological:Denies numbness, tingling or new weaknesses Behavioral/Psych: Mood is stable, no new changes  Breast:  denies any pain or lumps or nodules in either breasts All other systems were reviewed with the patient and are negative.  I have reviewed the past medical history, past surgical history,  social history and family history with the patient and they are unchanged from previous note.  ALLERGIES:  has No Known Allergies.  MEDICATIONS:  Current Outpatient Prescriptions  Medication Sig Dispense Refill  . aspirin EC 81 MG tablet Take 1 tablet (81 mg total) by mouth daily. 30 tablet 1  . bimatoprost (LUMIGAN) 0.01 % SOLN 1 drop at bedtime.    . Calcium-Vitamin D (CALTRATE 600 PLUS-VIT D PO) Take 2 tablets by mouth every evening.     . dorzolamide-timolol (COSOPT) 22.3-6.8 MG/ML ophthalmic solution Place into both eyes 2 (two) times daily.     Marland Kitchen ezetimibe (ZETIA) 10 MG tablet Take 10 mg by mouth daily.    Marland Kitchen levothyroxine (SYNTHROID, LEVOTHROID) 112 MCG tablet Take 112 mcg by mouth daily.  11  . multivitamin (THERAGRAN) per tablet Take 1 tablet by mouth daily.     . Omega-3 Fatty Acids (FISH OIL) 1000 MG CAPS Take 2,000 capsules by mouth 2 (two) times daily.    Marland Kitchen PROCTOFOAM HC rectal foam   1  . vitamin E 400 UNIT capsule Take 400 Units by mouth every morning.     No current facility-administered medications for this visit.    PHYSICAL EXAMINATION: ECOG PERFORMANCE STATUS: 0 - Asymptomatic  Filed Vitals:   04/01/15 1442  BP: 153/63  Pulse: 71  Temp: 97.8 F (36.6 C)  Resp: 18   Filed Weights   04/01/15 1442  Weight: 192 lb 4.8 oz (87.227 kg)    GENERAL:alert, no distress and comfortable SKIN: skin color, texture, turgor are normal, no rashes or significant lesions EYES: normal, Conjunctiva are pink and non-injected, sclera clear OROPHARYNX:no exudate, no erythema and  lips, buccal mucosa, and tongue normal  NECK: supple, thyroid normal size, non-tender, without nodularity LYMPH:  no palpable lymphadenopathy in the cervical, axillary or inguinal LUNGS: clear to auscultation and percussion with normal breathing effort HEART: regular rate & rhythm and no murmurs and no lower extremity edema ABDOMEN:abdomen soft, non-tender and normal bowel sounds Musculoskeletal:no  cyanosis of digits and no clubbing  NEURO: alert & oriented x 3 with fluent speech, no focal motor/sensory deficits LABORATORY DATA:  I have reviewed the data as listed   Chemistry      Component Value Date/Time   NA 139 05/24/2013 0940   K 3.6 05/24/2013 0940   CL 105 05/24/2013 0940   CO2 27 05/24/2013 0940   BUN 22 05/24/2013 0940   CREATININE 0.83 05/24/2013 0940      Component Value Date/Time   CALCIUM 8.8 05/24/2013 0940   ALKPHOS 44 05/16/2013 1013   AST 22 05/16/2013 1013   ALT 27 05/16/2013 1013   BILITOT 0.4 05/16/2013 1013       Lab Results  Component Value Date   WBC 15.4* 06/06/2013   HGB 13.8 03/19/2015   HCT 32.1* 06/06/2013   MCV 94.1 06/06/2013   PLT 538* 06/06/2013   NEUTROABS 13.2* 06/06/2013   ASSESSMENT & PLAN:  Breast cancer of upper-inner quadrant of right female breast Right lumpectomy 03/19/2015: IDC 1.7 cm with DCIS, micromet in one lymph node measuring 2 mm negative for ECS, other lymph node negative, T1cN1a stage II a, ER 99%, PR 98%, HER-2 negative, Ki-67 12%  Pathology review: I discussed pathology reporting great detail and provided her with a copy of this report. The concern is regarding the micrometastases in one of the lymph nodes was taken out. This is quite contrary to our initial understanding of the tumor being a grade 1 with a low Ki-67.   Recommendation: 1. Mammaprint testing to determine risk and an effective chemotherapy 2. Followed by radiation therapy 3. Followed by antiestrogen therapy  Return to clinic in  3 weeks after Mammaprint result is available    No orders of the defined types were placed in this encounter.   The patient has a good understanding of the overall plan. she agrees with it. she will call with any problems that may develop before the next visit here.   Rulon Eisenmenger, MD

## 2015-04-01 NOTE — Progress Notes (Signed)
Met with pt during post op with Dr. Lindi Adie. Relate she is doing well and without complaints. Per Dr. Lindi Adie ordered Mammaprint. Requisition sent to pathology/[facility]. Received by Alyse Low. Denies further questions or concerns. Received verbal understanding.

## 2015-04-01 NOTE — Assessment & Plan Note (Signed)
Right lumpectomy 03/19/2015: IDC 1.7 cm with DCIS, micromet in one lymph node measuring 2 mm negative for ECS, other lymph node negative, T1cN1a stage II a, ER 99%, PR 98%, HER-2 negative, Ki-67 12%  Pathology review: I discussed pathology reporting great detail and provided her with a copy of this report. The concern is regarding the micrometastases in one of the lymph nodes was taken out. This is quite contrary to our initial understanding of the tumor being a grade 1 with a low Ki-67.   Recommendation: 1. Oncotype DX testing to determine chemotherapy benefit 2. Followed by radiation therapy 3. Followed by antiestrogen therapy  Return to clinic in 2 or 3 weeks after Oncotype DX result is available

## 2015-04-01 NOTE — Telephone Encounter (Signed)
Appointments made and avs printed for patient,called rad onc and they will call the patient with her appointment

## 2015-04-11 ENCOUNTER — Telehealth: Payer: Self-pay | Admitting: *Deleted

## 2015-04-11 ENCOUNTER — Encounter (HOSPITAL_COMMUNITY): Payer: Self-pay

## 2015-04-11 NOTE — Telephone Encounter (Signed)
Received mammaprint result of Low Risk. Copy placed on Dr. Geralyn Flash desk. Original to HIM for scanning.

## 2015-04-17 ENCOUNTER — Encounter: Payer: Self-pay | Admitting: *Deleted

## 2015-04-17 NOTE — Progress Notes (Signed)
Received mammaprint from [facility], sent to scan.

## 2015-04-21 ENCOUNTER — Other Ambulatory Visit: Payer: Self-pay | Admitting: Oncology

## 2015-04-21 ENCOUNTER — Other Ambulatory Visit: Payer: Self-pay

## 2015-04-21 ENCOUNTER — Telehealth: Payer: Self-pay

## 2015-04-21 NOTE — Telephone Encounter (Signed)
Mammaprint results dtd 04/10/15 rcvd from [facility].  Reviewed by Dr. Lindi Adie, Sent to scan.

## 2015-04-21 NOTE — Progress Notes (Signed)
Location of Breast Cancer: Right Upper, Inner Quadrant  Diagnosis 1. Lymph node, sentinel, biopsy, Right axillary - ONE LYMPH NODE, POSITIVE FOR METASTATIC MAMMARY CARCINOMA, (1/1). - INTRANODAL TUMOR DEPOSIT IS 2 MM. - NEGATIVE FOR EXTRACAPSULAR TUMOR EXTENSION. 2. Fatty tissue, Right node - ONE LYMPH NODE, NEGATIVE FOR TUMOR (0/1). 1 of 4 Amended copy Amended FINAL for CADYN, RODGER ([id number]) Diagnosis(continued) 3. Breast, lumpectomy, Right - INVASIVE DUCTAL CARCINOMA, SEE COMMENT. - DUCTAL CARCINOMA IN SITU. - PREVIOUS BIOPSY SITE. - SEE TUMOR SYNOPTIC TEMPLATE BELOW.   Histology per Pathology Report: 02/20/15 Diagnosis Breast, right, needle core biopsy - INVASIVE DUCTAL CARCINOMA, SEE COMMENT. - DUCTAL CARCINOMA IN SITU   Receptor Status: ER(99%), PR (98%), Her2-neu (Neg), Ki-67(12%)   Did patient present with symptoms (if so, please note symptoms) or was this found on screening mammography?:   Past/Anticipated interventions by surgeon, if any: Lumpectomy of Right Breast  Past/Anticipated interventions by medical oncology, if any: Recommendation: 04/01/2015 1. Mammaprint testing to determine risk and an effective chemotherapy 2. Followed by radiation therapy 3. Followed by antiestrogen therapy   Lymphedema issues, if any: no  Pain issues, if any:no  SAFETY ISSUES:no  Prior radiation? No  Pacemaker/ICD? No  Possible current pregnancy?No  Is the patient on methotrexate? No  Current Complaints / other details: Married,no family hx  Of cancerwas adopted  Menarche age 89, Parity age 14 or 49, G1,P1, No BC, HRT less than 1 year Menopause early 40's   BP 151/57 mmHg  Pulse 64  Temp(Src) 97.6 F (36.4 C) (Oral)  Resp 16  Ht _0  (1.702 m)  Wt 193 lb 12.8 oz (87.907 kg)  BMI 30.35 kg/m2  Wt Readings from Last 3 Encounters:  04/22/15 193 lb 12.8 oz (87.907 kg)  04/01/15 192 lb 4.8 oz (87.227 kg)  03/19/15 189 lb (85.73 kg)   Gaspar Garbe, RN II Rad/Onc

## 2015-04-22 ENCOUNTER — Ambulatory Visit
Admission: RE | Admit: 2015-04-22 | Discharge: 2015-04-22 | Disposition: A | Discharge status: Home / Self Care | Payer: Medicare Other | Source: Ambulatory Visit | Attending: Radiation Oncology | Admitting: Radiation Oncology

## 2015-04-22 ENCOUNTER — Encounter: Payer: Self-pay | Admitting: Radiation Oncology

## 2015-04-22 VITALS — BP 151/57 | HR 64 | Temp 97.6°F | Resp 16 | Ht 67.0 in | Wt 193.8 lb

## 2015-04-22 DIAGNOSIS — C50211 Malignant neoplasm of upper-inner quadrant of right female breast: Secondary | ICD-10-CM

## 2015-04-22 NOTE — Progress Notes (Signed)
CC: Dr. Mayra Weaver, Dr. 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 is a pleasant 74 year old female who is seen today for review and discussion of radiation therapy in the management of her T1 N0 invasive ductal/DCIS of the right breast.  I first saw the patient in consultation on 03/04/2015. At the time of a screening mammogram on 02/03/2015 she was felt to have possible distortion in the right breast. Additional views confirmed distortion along the medial right breast. Targeted ultrasound was without evidence for a sonographic correlate. Stereotactic biopsy of distortion at 2:00 within the upper inner quadrant of the right breast on 02/20/2015 was diagnostic for invasive ductal carcinoma with DCIS. The tumor was felt to be grade 1. I do not see hormone receptor data at the time of this dictation. Breast MR on 02/25/2015 showed a 1.3 x 0.6 x 0.6 cm bilobed area of nodularity with biopsy changes within the upper inner quadrant of the right breast. There were no other suspicious lesions and no abnormal appearing lymph nodes. She was seen in consultation by Dr. Lindi Weaver as well.  On 03/19/2015 showed underwent a right partial mastectomy and sentinel lymph node biopsy.  Her primary tumor was 1.7 cm in the closest margin was 0.5 cm for both invasive and in situ disease (superior).  A single sentinel lymph node contained a 2 mm metastatic deposit in the description of the report, however on page 2 the lymph node is listed as having a macro metastasis (greater than 2.0 mm).  Another non-sentinel lymph node was free of metastatic disease.  Dr. Lindi Weaver obtained a Mammaprint in her risk for recurrent disease was felt to be "low-risk" with a luminal type of molecular subtype.  He plans on giving antiestrogen therapy following completion of radiation therapy.  She is without complaints today.  Physical examination: Alert and  oriented. Filed Vitals:   04/22/15 0728  BP: 151/57  Pulse: 64  Temp: 97.6 F (36.4 C)  Resp: 16   Head and neck examination: Grossly unremarkable.  Nodes: There is no palpable cervical, supraclavicular, or axillary lymphadenopathy.  Chest: Lungs clear.  Breasts: There is a partial mastectomy wound along this.  Aspect of the right breast extending from 11- 1:00.  No masses are appreciated.  Left breast without masses or lesions.  Extremities: Without edema.  Impression: Stage I B (T1 N22mi M0) invasive ductal/DCIS of the right breast.  I'll need to get clarification about her exact nodal staging.  Regardless, I do recommend radiation therapy with high tangents to cover her axilla.  Because of coverage of the axilla I do recommend standard fractionation to deliver 5000 cGy in 25 sessions, no boost.  We again discussed the potential acute and late toxicities of radiation therapy and she wishes to proceed as outlined.  Consent is signed today.   Plan: As above.  I just spoke with Dr. 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  30 minutes was spent face-to-face with the patient, primarily counseling patient and coordinating her care.

## 2015-04-22 NOTE — Progress Notes (Signed)
Please see the Nurse Progress Note in the MD Initial Consult Encounter for this patient. 

## 2015-04-23 ENCOUNTER — Ambulatory Visit (HOSPITAL_BASED_OUTPATIENT_CLINIC_OR_DEPARTMENT_OTHER): Payer: Medicare Other | Admitting: Hematology and Oncology

## 2015-04-23 ENCOUNTER — Encounter: Payer: Self-pay | Admitting: Hematology and Oncology

## 2015-04-23 ENCOUNTER — Telehealth: Payer: Self-pay | Admitting: Hematology and Oncology

## 2015-04-23 VITALS — BP 118/72 | HR 72 | Temp 97.8°F | Resp 18 | Ht 67.0 in | Wt 194.2 lb

## 2015-04-23 DIAGNOSIS — Z79810 Long term (current) use of selective estrogen receptor modulators (SERMs): Secondary | ICD-10-CM

## 2015-04-23 DIAGNOSIS — Z853 Personal history of malignant neoplasm of breast: Secondary | ICD-10-CM | POA: Diagnosis not present

## 2015-04-23 DIAGNOSIS — C50211 Malignant neoplasm of upper-inner quadrant of right female breast: Secondary | ICD-10-CM

## 2015-04-23 MED ORDER — ANASTROZOLE 1 MG PO TABS: 1.0000 mg | ORAL_TABLET | Freq: Every day | ORAL | Status: DC

## 2015-04-23 NOTE — Assessment & Plan Note (Signed)
Right lumpectomy 03/19/2015: IDC 1.7 cm with DCIS, micromet in one lymph node measuring 2 mm negative for ECS, other lymph node negative, T1cN1a stage II a, ER 99%, PR 98%, HER-2 negative, Ki-67 12%, Mammaprint low-risk luminal A  Mammaprint review: I discussed the Mammaprint result in great detail the patient and explained the difference between luminal a and luminal B. Based upon the genetic assay we do not recommend systemic chemotherapy.  Recommendation: 1. Adjuvant radiation therapy followed by 2. Antiestrogen therapy  Return to clinic in 2 months for follow-up

## 2015-04-23 NOTE — Telephone Encounter (Signed)
Gave patient avs report and appointments for October  °

## 2015-04-23 NOTE — Progress Notes (Signed)
Patient Care Team: Mayra Neer, MD as PCP - General (Family Medicine)  DIAGNOSIS: No matching staging information was found for the patient.  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; ER 99%, PR 98%, Ki-67 12%, HER-2 negative ratio 1.71   02/25/2015 Breast MRI Right breast: 1.3 x 0.6 x 0.6 cm bilobed area of nodularity in the right breast, post biopsy change   03/19/2015 Surgery Right lumpectomy: IDC 1.7 cm with DCIS, micromet in one lymph node measuring 2 mm negative for ECS, other lymph node negative, T1cN1a stage II a, ER 99%, PR 98%, HER-2 negative, Ki-67 12%, Mammaprint low risk luminal A    CHIEF COMPLIANT: Follow-up of Mammaprint  INTERVAL HISTORY: [name] is a 74 year old with above-mentioned history of right breast cancer treated with lumpectomy and was found to have lymph node involvement. We sent her for Mammaprint testing which came back as low risk. She is here today to discuss a Mammaprint report. She is planning to start radiation relatively soon.  REVIEW OF SYSTEMS:   Constitutional: Denies fevers, chills or abnormal weight loss Eyes: Denies blurriness of vision Ears, nose, mouth, throat, and face: Denies mucositis or sore throat Respiratory: Denies cough, dyspnea or wheezes Cardiovascular: Denies palpitation, chest discomfort or lower extremity swelling Gastrointestinal:  Denies nausea, heartburn or change in bowel habits Skin: Denies abnormal skin rashes Lymphatics: Denies new lymphadenopathy or easy bruising Neurological:Denies numbness, tingling or new weaknesses Behavioral/Psych: Mood is stable, no new changes  Breast:  denies any pain or lumps or nodules in either breasts All other systems were reviewed with the patient and are negative.  I have reviewed the past medical history, past surgical history, social  history and family history with the patient and they are unchanged from previous note.  ALLERGIES:  has No Known Allergies.  MEDICATIONS:  Current Outpatient Prescriptions  Medication Sig Dispense Refill  . aspirin EC 81 MG tablet Take 1 tablet (81 mg total) by mouth daily. 30 tablet 1  . bimatoprost (LUMIGAN) 0.01 % SOLN 1 drop at bedtime.    . Calcium-Vitamin D (CALTRATE 600 PLUS-VIT D PO) Take 2 tablets by mouth every evening.     . docusate sodium (COLACE) 100 MG capsule Take 100 mg by mouth daily as needed for mild constipation.    . dorzolamide-timolol (COSOPT) 22.3-6.8 MG/ML ophthalmic solution Place into both eyes 2 (two) times daily.     Marland Kitchen ezetimibe (ZETIA) 10 MG tablet Take 10 mg by mouth daily.    Marland Kitchen levothyroxine (SYNTHROID, LEVOTHROID) 112 MCG tablet Take 112 mcg by mouth daily.  11  . multivitamin (THERAGRAN) per tablet Take 1 tablet by mouth daily.     . naproxen sodium (ANAPROX) 220 MG tablet Take 220 mg by mouth as needed.    . Omega-3 Fatty Acids (FISH OIL) 1000 MG CAPS Take 2,000 capsules by mouth 2 (two) times daily.    Marland Kitchen PROCTOFOAM HC rectal foam Place 1 applicator rectally as needed.   1  . vitamin E 400 UNIT capsule Take 400 Units by mouth every morning.    Marland Kitchen anastrozole (ARIMIDEX) 1 MG tablet Take 1 tablet (1 mg total) by mouth daily. 90 tablet 3   No current facility-administered medications for this visit.    PHYSICAL EXAMINATION: ECOG PERFORMANCE STATUS: 1 - Symptomatic but completely ambulatory  Filed Vitals:   04/23/15 1357  BP: 118/72  Pulse: 72  Temp: 97.8 F (36.6 C)  Resp: 18   Filed Weights   04/23/15 1357  Weight: 194 lb 3.2 oz (88.089 kg)    GENERAL:alert, no distress and comfortable SKIN: skin color, texture, turgor are normal, no rashes or significant lesions EYES: normal, Conjunctiva are pink and non-injected, sclera clear OROPHARYNX:no exudate, no erythema and lips, buccal mucosa, and tongue normal  NECK: supple, thyroid normal size,  non-tender, without nodularity LYMPH:  no palpable lymphadenopathy in the cervical, axillary or inguinal LUNGS: clear to auscultation and percussion with normal breathing effort HEART: regular rate & rhythm and no murmurs and no lower extremity edema ABDOMEN:abdomen soft, non-tender and normal bowel sounds Musculoskeletal:no cyanosis of digits and no clubbing  NEURO: alert & oriented x 3 with fluent speech, no focal motor/sensory deficits  LABORATORY DATA:  I have reviewed the data as listed   Chemistry      Component Value Date/Time   NA 139 05/24/2013 0940   K 3.6 05/24/2013 0940   CL 105 05/24/2013 0940   CO2 27 05/24/2013 0940   BUN 22 05/24/2013 0940   CREATININE 0.83 05/24/2013 0940      Component Value Date/Time   CALCIUM 8.8 05/24/2013 0940   ALKPHOS 44 05/16/2013 1013   AST 22 05/16/2013 1013   ALT 27 05/16/2013 1013   BILITOT 0.4 05/16/2013 1013       Lab Results  Component Value Date   WBC 15.4* 06/06/2013   HGB 13.8 03/19/2015   HCT 32.1* 06/06/2013   MCV 94.1 06/06/2013   PLT 538* 06/06/2013   NEUTROABS 13.2* 06/06/2013    ASSESSMENT & PLAN:  Breast cancer of upper-inner quadrant of right female breast Right lumpectomy 03/19/2015: IDC 1.7 cm with DCIS, micromet in one lymph node measuring 2 mm negative for ECS, other lymph node negative, T1cN1a stage II a, ER 99%, PR 98%, HER-2 negative, Ki-67 12%, Mammaprint low-risk luminal A  Mammaprint review: I discussed the Mammaprint result in great detail the patient and explained the difference between luminal a and luminal B. Based upon the genetic assay we do not recommend systemic chemotherapy.  Recommendation: 1. Adjuvant radiation therapy followed by 2. Antiestrogen therapy with anastrozole 1 mg daily 5 years  Aromatase inhibitor counseling: We discussed the risks and benefits of anti-estrogen therapy with aromatase inhibitors. These include but not limited to insomnia, hot flashes, mood changes, vaginal  dryness, bone density loss, and weight gain. Although rare, serious side effects including risk of blood clots were also discussed. We strongly believe that the benefits far outweigh the risks. Patient understands these risks and consented to starting treatment. Planned treatment duration is 5 years.  Return to clinic in October which will be approximately one month after starting treatment  No orders of the defined types were placed in this encounter.   The patient has a good understanding of the overall plan. she agrees with it. she will call with any problems that may develop before the next visit here.   Rulon Eisenmenger, MD

## 2015-04-24 ENCOUNTER — Ambulatory Visit
Admission: RE | Admit: 2015-04-24 | Discharge: 2015-04-24 | Disposition: A | Discharge status: Home / Self Care | Payer: Medicare Other | Source: Ambulatory Visit | Attending: Radiation Oncology | Admitting: Radiation Oncology

## 2015-04-24 DIAGNOSIS — C50211 Malignant neoplasm of upper-inner quadrant of right female breast: Secondary | ICD-10-CM

## 2015-04-24 NOTE — Progress Notes (Signed)
Complex simulation/treatment planning note: The patient was taken to the CT simulator.  A Vac-Lok immobilization device was constructed on a custom breast board.  Her right breast borders were marked with radiopaque wires.  Her partial mastectomy scar was also marked.  She was then scanned.  I chose an isocenter along her central breast.  The CT data set was sent to the planning system where contoured her tumor bed.  She was setup to medial and lateral high right breast tangents with 2 unique MLCs.  She is now ready for 3-D simulation.  I prescribing 4800 cGy in 24 sessions.  No boost.

## 2015-04-30 ENCOUNTER — Encounter: Payer: Self-pay | Admitting: Radiation Oncology

## 2015-04-30 DIAGNOSIS — C50211 Malignant neoplasm of upper-inner quadrant of right female breast: Secondary | ICD-10-CM | POA: Diagnosis not present

## 2015-04-30 NOTE — Progress Notes (Signed)
3-D simulation note: The patient completed 3-D simulation for treatment to her right breast.  She was set up to medial and lateral right high breast tangents utilizing 4 complex treatment devices.  Dose volume histograms were obtained for the target structures/tumor bed and also avoidance structures including the lungs and heart.  We met our departmental guidelines.  I'm prescribing 4800 cGy in 24 sessions utilizing mixed 6 MV/10 MV photons.

## 2015-05-01 DIAGNOSIS — C50211 Malignant neoplasm of upper-inner quadrant of right female breast: Secondary | ICD-10-CM | POA: Diagnosis not present

## 2015-05-02 ENCOUNTER — Ambulatory Visit
Admission: RE | Admit: 2015-05-02 | Discharge: 2015-05-02 | Disposition: A | Discharge status: Home / Self Care | Payer: Medicare Other | Source: Ambulatory Visit | Attending: Radiation Oncology | Admitting: Radiation Oncology

## 2015-05-02 DIAGNOSIS — C50211 Malignant neoplasm of upper-inner quadrant of right female breast: Secondary | ICD-10-CM | POA: Diagnosis not present

## 2015-05-05 ENCOUNTER — Ambulatory Visit
Admission: RE | Admit: 2015-05-05 | Discharge: 2015-05-05 | Disposition: A | Discharge status: Home / Self Care | Payer: Medicare Other | Source: Ambulatory Visit | Attending: Radiation Oncology | Admitting: Radiation Oncology

## 2015-05-05 ENCOUNTER — Encounter: Payer: Self-pay | Admitting: *Deleted

## 2015-05-05 ENCOUNTER — Encounter: Payer: Self-pay | Admitting: Radiation Oncology

## 2015-05-05 VITALS — BP 121/55 | HR 72 | Temp 98.2°F | Ht 67.0 in | Wt 191.9 lb

## 2015-05-05 DIAGNOSIS — C50211 Malignant neoplasm of upper-inner quadrant of right female breast: Secondary | ICD-10-CM | POA: Diagnosis not present

## 2015-05-05 MED ORDER — RADIAPLEXRX EX GEL
Freq: Once | CUTANEOUS | Status: AC
  Administered 2015-05-05: 16:00:00 via TOPICAL

## 2015-05-05 MED ORDER — ALRA NON-METALLIC DEODORANT (RAD-ONC)
1.0000 "application " | Freq: Once | TOPICAL | Status: AC
  Administered 2015-05-05: 1 via TOPICAL

## 2015-05-05 NOTE — Progress Notes (Addendum)
[name] has received 1 fraction to her right breast.  No concerns at this time.  Pt here for patient teaching.  Pt given Radiation and You booklet, skin care instructions, Alra deodorant and Radiaplex gel. Reviewed areas of pertinence such as fatigue, skin changes, breast tenderness and breast swelling . Pt able to give teach back of to pat skin, use unscented/gentle soap, use baby wipes, have Imodium on hand, drink plenty of water and sitz bath,apply Radiaplex bid, avoid applying anything to skin within 4 hours of treatment, avoid wearing an under wire bra and to use an electric razor if they must shave. Pt demonstrated understanding of information given and will contact nursing with any questions or concerns.  Given This RN's card.  Informed that she will be seen every Monday after treatment by Dr. Valere Dross, but if this changes he will notify in advance.  Teach back.

## 2015-05-05 NOTE — Progress Notes (Signed)
   Weekly Management Note:  Outpatient    ICD-9-CM ICD-10-CM   1. Breast cancer of upper-inner quadrant of right female breast 174.2 C50.211 hyaluronate sodium (RADIAPLEXRX) gel     non-metallic deodorant (ALRA) 1 application    Current Dose:  2 Gy  Projected Dose: 48 Gy  initial  Narrative:  The patient presents for routine under treatment assessment.  CBCT/MVCT images/Port film x-rays were reviewed.  The chart was checked. No complaints   Physical Findings:  height is 5\' 7"  (1.702 m) and weight is 191 lb 14.4 oz (87.045 kg). Her temperature is 98.2 F (36.8 C). Her blood pressure is 121/55 and her pulse is 72.   Wt Readings from Last 3 Encounters:  05/05/15 191 lb 14.4 oz (87.045 kg)  04/23/15 194 lb 3.2 oz (88.089 kg)  04/22/15 193 lb 12.8 oz (87.907 kg)   Skin exam deferred. NAD  Impression:  The patient is tolerating radiotherapy.  Plan:  Continue radiotherapy as planned.    ________________________________   Eppie Gibson, M.D.

## 2015-05-05 NOTE — Progress Notes (Signed)
Met with pt prior to 1st xrt. Relate doing well and is without complaints. Requested special deodorant and lotion. Informed xrt tech of pt request. Encourage pt to call with needs. Received verbal understanding.

## 2015-05-06 ENCOUNTER — Ambulatory Visit
Admission: RE | Admit: 2015-05-06 | Discharge: 2015-05-06 | Disposition: A | Discharge status: Home / Self Care | Payer: Medicare Other | Source: Ambulatory Visit | Attending: Radiation Oncology | Admitting: Radiation Oncology

## 2015-05-06 DIAGNOSIS — C50211 Malignant neoplasm of upper-inner quadrant of right female breast: Secondary | ICD-10-CM | POA: Diagnosis not present

## 2015-05-07 ENCOUNTER — Ambulatory Visit
Admission: RE | Admit: 2015-05-07 | Discharge: 2015-05-07 | Disposition: A | Discharge status: Home / Self Care | Payer: Medicare Other | Source: Ambulatory Visit | Attending: Radiation Oncology | Admitting: Radiation Oncology

## 2015-05-07 DIAGNOSIS — C50211 Malignant neoplasm of upper-inner quadrant of right female breast: Secondary | ICD-10-CM | POA: Diagnosis not present

## 2015-05-08 ENCOUNTER — Ambulatory Visit
Admission: RE | Admit: 2015-05-08 | Discharge: 2015-05-08 | Disposition: A | Discharge status: Home / Self Care | Payer: Medicare Other | Source: Ambulatory Visit | Attending: Radiation Oncology | Admitting: Radiation Oncology

## 2015-05-08 DIAGNOSIS — C50211 Malignant neoplasm of upper-inner quadrant of right female breast: Secondary | ICD-10-CM | POA: Diagnosis not present

## 2015-05-09 ENCOUNTER — Ambulatory Visit
Admission: RE | Admit: 2015-05-09 | Discharge: 2015-05-09 | Disposition: A | Discharge status: Home / Self Care | Payer: Medicare Other | Source: Ambulatory Visit | Attending: Radiation Oncology | Admitting: Radiation Oncology

## 2015-05-09 DIAGNOSIS — C50211 Malignant neoplasm of upper-inner quadrant of right female breast: Secondary | ICD-10-CM | POA: Diagnosis not present

## 2015-05-12 ENCOUNTER — Ambulatory Visit
Admission: RE | Admit: 2015-05-12 | Discharge: 2015-05-12 | Disposition: A | Discharge status: Home / Self Care | Payer: Medicare Other | Source: Ambulatory Visit | Attending: Radiation Oncology | Admitting: Radiation Oncology

## 2015-05-12 DIAGNOSIS — C50211 Malignant neoplasm of upper-inner quadrant of right female breast: Secondary | ICD-10-CM

## 2015-05-12 NOTE — Progress Notes (Signed)
Weekly Management Note:  Site: Right breast Current Dose:  1200  cGy Projected Dose: 4800  cGy  Narrative: The patient is seen today for routine under treatment assessment. CBCT/MVCT images/port films were reviewed. The chart was reviewed.   She is without complaints today.  She has Radioplex gel to use as needed.  Physical Examination: There were no vitals filed for this visit..  Weight:  .  No significant skin changes along the right breast.  Impression: Tolerating radiation therapy well.  Plan: Continue radiation therapy as planned.

## 2015-05-12 NOTE — Progress Notes (Signed)
[name] has received 6 fractions to her right breast.  Note mild redness near her areola.  Skin remains intact.

## 2015-05-13 ENCOUNTER — Ambulatory Visit
Admission: RE | Admit: 2015-05-13 | Discharge: 2015-05-13 | Disposition: A | Discharge status: Home / Self Care | Payer: Medicare Other | Source: Ambulatory Visit | Attending: Radiation Oncology | Admitting: Radiation Oncology

## 2015-05-13 DIAGNOSIS — C50211 Malignant neoplasm of upper-inner quadrant of right female breast: Secondary | ICD-10-CM | POA: Diagnosis not present

## 2015-05-14 ENCOUNTER — Ambulatory Visit
Admission: RE | Admit: 2015-05-14 | Discharge: 2015-05-14 | Disposition: A | Discharge status: Home / Self Care | Payer: Medicare Other | Source: Ambulatory Visit | Attending: Radiation Oncology | Admitting: Radiation Oncology

## 2015-05-14 DIAGNOSIS — C50211 Malignant neoplasm of upper-inner quadrant of right female breast: Secondary | ICD-10-CM | POA: Diagnosis not present

## 2015-05-15 ENCOUNTER — Ambulatory Visit
Admission: RE | Admit: 2015-05-15 | Discharge: 2015-05-15 | Disposition: A | Discharge status: Home / Self Care | Payer: Medicare Other | Source: Ambulatory Visit | Attending: Radiation Oncology | Admitting: Radiation Oncology

## 2015-05-15 DIAGNOSIS — C50211 Malignant neoplasm of upper-inner quadrant of right female breast: Secondary | ICD-10-CM | POA: Diagnosis not present

## 2015-05-16 ENCOUNTER — Ambulatory Visit
Admission: RE | Admit: 2015-05-16 | Discharge: 2015-05-16 | Disposition: A | Discharge status: Home / Self Care | Payer: Medicare Other | Source: Ambulatory Visit | Attending: Radiation Oncology | Admitting: Radiation Oncology

## 2015-05-16 DIAGNOSIS — C50211 Malignant neoplasm of upper-inner quadrant of right female breast: Secondary | ICD-10-CM | POA: Diagnosis not present

## 2015-05-19 ENCOUNTER — Encounter: Payer: Self-pay | Admitting: Radiation Oncology

## 2015-05-19 ENCOUNTER — Ambulatory Visit
Admission: RE | Admit: 2015-05-19 | Discharge: 2015-05-19 | Disposition: A | Discharge status: Home / Self Care | Payer: Medicare Other | Source: Ambulatory Visit | Attending: Radiation Oncology | Admitting: Radiation Oncology

## 2015-05-19 VITALS — BP 117/71 | HR 70 | Temp 98.0°F | Resp 12 | Wt 195.1 lb

## 2015-05-19 DIAGNOSIS — C50211 Malignant neoplasm of upper-inner quadrant of right female breast: Secondary | ICD-10-CM | POA: Diagnosis not present

## 2015-05-19 NOTE — Progress Notes (Signed)
Weekly Management Note:  Site: right breast Current Dose:   2200  cGy Projected Dose:  4800  cGy  Narrative: The patient is seen today for routine under treatment assessment. CBCT/MVCT images/port films were reviewed. The chart was reviewed.    She is without complaints today.  She uses Radioplex gel as needed.  Physical Examination:  Filed Vitals:   05/19/15 1427  BP: 117/71  Pulse: 70  Temp: 98 F (36.7 C)  Resp: 12  .  Weight: 195 lb 1.6 oz (88.497 kg).  There is faint erythema along the right breast with no areas of desquamation.  Impression: Tolerating radiation therapy well.  Plan: Continue radiation therapy as planned.

## 2015-05-19 NOTE — Progress Notes (Signed)
PAIN: She is currently in no pain.  SKIN: Pt right breast- warm dry and intact for.  Pt denies edema.  Pt continues to apply Radiaplex as directed. OTHER: Pt complains of fatigue. BP 117/71 mmHg  Pulse 70  Temp(Src) 98 F (36.7 C) (Oral)  Resp 12  Wt 195 lb 1.6 oz (88.497 kg)  SpO2 99% Wt Readings from Last 3 Encounters:  05/19/15 195 lb 1.6 oz (88.497 kg)  05/05/15 191 lb 14.4 oz (87.045 kg)  04/23/15 194 lb 3.2 oz (88.089 kg)

## 2015-05-20 ENCOUNTER — Ambulatory Visit
Admission: RE | Admit: 2015-05-20 | Discharge: 2015-05-20 | Disposition: A | Discharge status: Home / Self Care | Payer: Medicare Other | Source: Ambulatory Visit | Attending: Radiation Oncology | Admitting: Radiation Oncology

## 2015-05-20 DIAGNOSIS — C50211 Malignant neoplasm of upper-inner quadrant of right female breast: Secondary | ICD-10-CM | POA: Diagnosis not present

## 2015-05-21 ENCOUNTER — Ambulatory Visit
Admission: RE | Admit: 2015-05-21 | Discharge: 2015-05-21 | Disposition: A | Discharge status: Home / Self Care | Payer: Medicare Other | Source: Ambulatory Visit | Attending: Radiation Oncology | Admitting: Radiation Oncology

## 2015-05-21 DIAGNOSIS — C50211 Malignant neoplasm of upper-inner quadrant of right female breast: Secondary | ICD-10-CM | POA: Diagnosis not present

## 2015-05-22 ENCOUNTER — Ambulatory Visit
Admission: RE | Admit: 2015-05-22 | Discharge: 2015-05-22 | Disposition: A | Discharge status: Home / Self Care | Payer: Medicare Other | Source: Ambulatory Visit | Attending: Radiation Oncology | Admitting: Radiation Oncology

## 2015-05-22 DIAGNOSIS — C50211 Malignant neoplasm of upper-inner quadrant of right female breast: Secondary | ICD-10-CM | POA: Diagnosis not present

## 2015-05-23 ENCOUNTER — Ambulatory Visit
Admission: RE | Admit: 2015-05-23 | Discharge: 2015-05-23 | Disposition: A | Discharge status: Home / Self Care | Payer: Medicare Other | Source: Ambulatory Visit | Attending: Radiation Oncology | Admitting: Radiation Oncology

## 2015-05-23 DIAGNOSIS — C50211 Malignant neoplasm of upper-inner quadrant of right female breast: Secondary | ICD-10-CM | POA: Diagnosis not present

## 2015-05-26 ENCOUNTER — Ambulatory Visit
Admission: RE | Admit: 2015-05-26 | Discharge: 2015-05-26 | Disposition: A | Discharge status: Home / Self Care | Payer: Medicare Other | Source: Ambulatory Visit | Attending: Radiation Oncology | Admitting: Radiation Oncology

## 2015-05-26 DIAGNOSIS — C50211 Malignant neoplasm of upper-inner quadrant of right female breast: Secondary | ICD-10-CM

## 2015-05-26 MED ORDER — BIAFINE EX EMUL
CUTANEOUS | Status: DC | PRN
  Administered 2015-05-26: 14:00:00 via TOPICAL

## 2015-05-26 NOTE — Progress Notes (Signed)
[name] given biafine for itching in the right nipple region.  Note raised follicles in this area.  Skin intact in treatment field and skin with mild erythema.  Will reassess for relief on tomorrow.

## 2015-05-27 ENCOUNTER — Ambulatory Visit
Admission: RE | Admit: 2015-05-27 | Discharge: 2015-05-27 | Disposition: A | Discharge status: Home / Self Care | Payer: Medicare Other | Source: Ambulatory Visit | Attending: Radiation Oncology | Admitting: Radiation Oncology

## 2015-05-27 ENCOUNTER — Encounter: Payer: Self-pay | Admitting: Radiation Oncology

## 2015-05-27 VITALS — BP 120/58 | HR 71 | Temp 97.5°F | Resp 16 | Ht 67.0 in | Wt 192.5 lb

## 2015-05-27 DIAGNOSIS — C50211 Malignant neoplasm of upper-inner quadrant of right female breast: Secondary | ICD-10-CM

## 2015-05-27 NOTE — Progress Notes (Signed)
Weekly Management Note:  Site: Right breast Current Dose:  3400  cGy Projected Dose: 4800  cGy  Narrative: The patient is seen today for routine under treatment assessment. CBCT/MVCT images/port films were reviewed. The chart was reviewed.   She is without complaints today except for some pruritus along her right nipple.  She was given Biafine cream.  Physical Examination:  Filed Vitals:   05/27/15 1418  BP: 120/58  Pulse: 71  Temp: 97.5 F (36.4 C)  Resp: 16  .  Weight: 192 lb 8 oz (87.317 kg).  There is mild erythema along the right breast with no areas of desquamation.  Impression: Tolerating radiation therapy well.  Plan: Continue radiation therapy as planned.

## 2015-05-27 NOTE — Progress Notes (Signed)
[name] has completed 17 fractions to her right breast.  She denies pain.  She reports fatigue.  Her right breast is pink.  She reports having itching around her nipple and has started using biafine.  BP 120/58 mmHg  Pulse 71  Temp(Src) 97.5 F (36.4 C) (Oral)  Resp 16  Ht 5\' 7"  (1.702 m)  Wt 192 lb 8 oz (87.317 kg)  BMI 30.14 kg/m2

## 2015-05-28 ENCOUNTER — Ambulatory Visit
Admission: RE | Admit: 2015-05-28 | Discharge: 2015-05-28 | Disposition: A | Discharge status: Home / Self Care | Payer: Medicare Other | Source: Ambulatory Visit | Attending: Radiation Oncology | Admitting: Radiation Oncology

## 2015-05-28 DIAGNOSIS — C50211 Malignant neoplasm of upper-inner quadrant of right female breast: Secondary | ICD-10-CM | POA: Diagnosis not present

## 2015-05-29 ENCOUNTER — Ambulatory Visit
Admission: RE | Admit: 2015-05-29 | Discharge: 2015-05-29 | Disposition: A | Discharge status: Home / Self Care | Payer: Medicare Other | Source: Ambulatory Visit | Attending: Radiation Oncology | Admitting: Radiation Oncology

## 2015-05-29 DIAGNOSIS — C50211 Malignant neoplasm of upper-inner quadrant of right female breast: Secondary | ICD-10-CM | POA: Diagnosis not present

## 2015-05-30 ENCOUNTER — Ambulatory Visit
Admission: RE | Admit: 2015-05-30 | Discharge: 2015-05-30 | Disposition: A | Discharge status: Home / Self Care | Payer: Medicare Other | Source: Ambulatory Visit | Attending: Radiation Oncology | Admitting: Radiation Oncology

## 2015-05-30 DIAGNOSIS — C50211 Malignant neoplasm of upper-inner quadrant of right female breast: Secondary | ICD-10-CM | POA: Diagnosis not present

## 2015-06-02 ENCOUNTER — Ambulatory Visit
Admission: RE | Admit: 2015-06-02 | Discharge: 2015-06-02 | Disposition: A | Discharge status: Home / Self Care | Payer: Medicare Other | Source: Ambulatory Visit | Attending: Radiation Oncology | Admitting: Radiation Oncology

## 2015-06-02 VITALS — BP 130/65 | HR 71 | Temp 97.5°F | Resp 12 | Wt 195.3 lb

## 2015-06-02 DIAGNOSIS — C50211 Malignant neoplasm of upper-inner quadrant of right female breast: Secondary | ICD-10-CM | POA: Diagnosis not present

## 2015-06-02 DIAGNOSIS — Z510 Encounter for antineoplastic radiation therapy: Secondary | ICD-10-CM | POA: Insufficient documentation

## 2015-06-02 NOTE — Progress Notes (Signed)
   Weekly Management Note:  outpatient    ICD-9-CM ICD-10-CM   1. Breast cancer of upper-inner quadrant of right female breast 174.2 C50.211     Current Dose:  42 Gy  Projected Dose: 48 Gy   Narrative:  The patient presents for routine under treatment assessment.  CBCT/MVCT images/Port film x-rays were reviewed.  The chart was checked. Doing well  Physical Findings:  weight is 195 lb 4.8 oz (88.587 kg). Her oral temperature is 97.5 F (36.4 C). Her blood pressure is 130/65 and her pulse is 71. Her respiration is 12 and oxygen saturation is 99%.   Wt Readings from Last 3 Encounters:  06/02/15 195 lb 4.8 oz (88.587 kg)  05/27/15 192 lb 8 oz (87.317 kg)  05/19/15 195 lb 1.6 oz (88.497 kg)   Slight erythema of right breast  Impression:  The patient is tolerating radiotherapy.  Plan:  Continue radiotherapy as planned. F/u 1 mo  ________________________________   Eppie Gibson, M.D.

## 2015-06-02 NOTE — Progress Notes (Signed)
PAIN: She rates her pain as a 1 on a scale of 0-10. intermittent and sharp over right breast nipple only occasionally and when laying on side. SKIN: Pt right breast- positive for Pruritus, erythema and breast tenderness.  Pt denies edema.  Pt continues to apply Biafine and Hydrocortisone as directed. OTHER: Pt complains of fatigue and weakness. BP 130/65 mmHg  Pulse 71  Temp(Src) 97.5 F (36.4 C) (Oral)  Resp 12  Wt 195 lb 4.8 oz (88.587 kg)  SpO2 99% Wt Readings from Last 3 Encounters:  06/02/15 195 lb 4.8 oz (88.587 kg)  05/27/15 192 lb 8 oz (87.317 kg)  05/19/15 195 lb 1.6 oz (88.497 kg)

## 2015-06-03 ENCOUNTER — Ambulatory Visit
Admission: RE | Admit: 2015-06-03 | Discharge: 2015-06-03 | Disposition: A | Discharge status: Home / Self Care | Payer: Medicare Other | Source: Ambulatory Visit | Attending: Radiation Oncology | Admitting: Radiation Oncology

## 2015-06-03 DIAGNOSIS — Z510 Encounter for antineoplastic radiation therapy: Secondary | ICD-10-CM | POA: Diagnosis not present

## 2015-06-04 ENCOUNTER — Ambulatory Visit
Admission: RE | Admit: 2015-06-04 | Discharge: 2015-06-04 | Disposition: A | Discharge status: Home / Self Care | Payer: Medicare Other | Source: Ambulatory Visit | Attending: Radiation Oncology | Admitting: Radiation Oncology

## 2015-06-04 DIAGNOSIS — Z510 Encounter for antineoplastic radiation therapy: Secondary | ICD-10-CM | POA: Diagnosis not present

## 2015-06-05 ENCOUNTER — Ambulatory Visit: Payer: Medicare Other

## 2015-06-05 ENCOUNTER — Ambulatory Visit
Admission: RE | Admit: 2015-06-05 | Discharge: 2015-06-05 | Disposition: A | Discharge status: Home / Self Care | Payer: Medicare Other | Source: Ambulatory Visit | Attending: Radiation Oncology | Admitting: Radiation Oncology

## 2015-06-05 DIAGNOSIS — Z510 Encounter for antineoplastic radiation therapy: Secondary | ICD-10-CM | POA: Diagnosis not present

## 2015-06-06 ENCOUNTER — Ambulatory Visit: Payer: Medicare Other

## 2015-06-06 ENCOUNTER — Encounter: Payer: Self-pay | Admitting: Radiation Oncology

## 2015-06-06 NOTE — Progress Notes (Signed)
[facility] End of Treatment Note  Name:[name]  Date: 06/06/2015 TRV:[id number] DOB:[birth year]   Status:outpatient    CC: Mayra Neer, MD ,  Dr. Autumn Messing III  REFERRING PHYSICIAN: Dr. Autumn Messing III     DIAGNOSIS:  Stage I B (T1 N63mi M0) invasive ductal/DCIS the right breast  INDICATION FOR TREATMENT: Curative   TREATMENT DATES: 05/05/2015 through 06/05/2015                          SITE/DOSE:    Right breast (high tangents) 4800 cGy in 24 sessions , no boost                       BEAMS/ENERGY:  Mixed 10 MV/6MV photons, tangential fields                 NARRATIVE: The patient tolerated treatment well with only mild to moderate radiation dermatitis by completion of therapy.  She did not have significant desquamation of her skin.                          PLAN: Routine followup in one month. Patient instructed to call if questions or worsening complaints in interim.

## 2015-06-09 ENCOUNTER — Telehealth: Payer: Self-pay | Admitting: *Deleted

## 2015-06-09 NOTE — Telephone Encounter (Signed)
Spoke to pt concerning needs following xrt. Relate doing well and without complaints. Encourage pt to call with questions or concerns. Confirmed future f/u appt.

## 2015-07-07 ENCOUNTER — Encounter: Payer: Self-pay | Admitting: Radiation Oncology

## 2015-07-08 ENCOUNTER — Ambulatory Visit
Admission: RE | Admit: 2015-07-08 | Discharge: 2015-07-08 | Disposition: A | Discharge status: Home / Self Care | Payer: Medicare Other | Source: Ambulatory Visit | Attending: Radiation Oncology | Admitting: Radiation Oncology

## 2015-07-08 ENCOUNTER — Encounter: Payer: Self-pay | Admitting: Radiation Oncology

## 2015-07-08 VITALS — BP 131/56 | HR 79 | Temp 97.8°F | Ht 67.0 in | Wt 195.2 lb

## 2015-07-08 DIAGNOSIS — C50211 Malignant neoplasm of upper-inner quadrant of right female breast: Secondary | ICD-10-CM

## 2015-07-08 HISTORY — DX: Personal history of irradiation: Z92.3

## 2015-07-08 NOTE — Progress Notes (Addendum)
[name] here for reassessment s/p xrt to her right breast which completed on 06/05/15.  She denies any pain presently, but reports intermittent aching in the prior field.  She also reports decrease of redness and tanning of her right breast and skin remains intact.  Started Arimidex on 06/26/15 and denies any hot flashes, nor joint pain.

## 2015-07-08 NOTE — Progress Notes (Signed)
CC: Dr. Autumn Messing III  Follow-up note: [name] returns today approximately 1 month following completion of radiation therapy following conservative surgery in the management of her T1 N5mi invasive ductal/DCIS of the right breast.  She was treated with high right breast tangents to include the lower axilla.  She is without complaints today.  She was started on Arimidex almost 2 weeks ago.  She will see Dr. Lindi Adie for a follow-up visit on October 6.  She saw Dr. Marlou Starks last week.  She is pleased with her cosmesis.  Physical examination: Alert and oriented. Filed Vitals:   07/08/15 1502  BP: 131/56  Pulse: 79  Temp: 97.8 F (36.6 C)   Head and neck examination: Grossly unremarkable.  Nodes: Without palpable cervical, supraclavicular, or axillary lymphadenopathy.  Breasts: There is minimal thickening of the right breast.  Cosmesis is excellent.  No masses are appreciated.  Left breast without masses or lesions.  Extremities: Without edema.  Impression: Satisfactory progress.  Plan: Follow-up through Dr. Lindi Adie and Dr. Marlou Starks.  She should have bilateral mammography sometime next spring in that her initial screening mammogram was in April 2016.

## 2015-07-30 NOTE — Assessment & Plan Note (Signed)
Right lumpectomy 03/19/2015: IDC 1.7 cm with DCIS, micromet in one lymph node measuring 2 mm negative for ECS, other lymph node negative, T1cN1a stage II a, ER 99%, PR 98%, HER-2 negative, Ki-67 12%, Mammaprint low-risk luminal A, status post adjuvant radiation, started anastrozole 1 mg daily 06/18/2015  Anastrozole toxicities:  Breast cancer surveillance: 1. Periodic breast exams every 4-6 months 2. Annual mammograms  Return to clinic in 6 months for follow-up

## 2015-07-31 ENCOUNTER — Ambulatory Visit (HOSPITAL_BASED_OUTPATIENT_CLINIC_OR_DEPARTMENT_OTHER): Payer: Medicare Other | Admitting: Hematology and Oncology

## 2015-07-31 ENCOUNTER — Encounter: Payer: Self-pay | Admitting: Hematology and Oncology

## 2015-07-31 ENCOUNTER — Telehealth: Payer: Self-pay | Admitting: Hematology and Oncology

## 2015-07-31 VITALS — BP 147/62 | HR 78 | Temp 97.3°F | Resp 18 | Ht 67.0 in | Wt 194.1 lb

## 2015-07-31 DIAGNOSIS — C50211 Malignant neoplasm of upper-inner quadrant of right female breast: Secondary | ICD-10-CM

## 2015-07-31 DIAGNOSIS — N951 Menopausal and female climacteric states: Secondary | ICD-10-CM | POA: Diagnosis not present

## 2015-07-31 DIAGNOSIS — C773 Secondary and unspecified malignant neoplasm of axilla and upper limb lymph nodes: Secondary | ICD-10-CM | POA: Diagnosis not present

## 2015-07-31 DIAGNOSIS — R682 Dry mouth, unspecified: Secondary | ICD-10-CM | POA: Diagnosis not present

## 2015-07-31 NOTE — Progress Notes (Signed)
Patient Care Team: Mayra Neer, MD as PCP - General (Family Medicine)  DIAGNOSIS: No matching staging information was found for the patient.  SUMMARY OF ONCOLOGIC HISTORY:   Breast cancer of upper-inner quadrant of right female breast ([place])   02/20/2015 Initial Diagnosis Right breast biopsy: Invasive ductal carcinoma grade 1 with DCIS; ER 99%, PR 98%, Ki-67 12%, HER-2 negative ratio 1.71   02/25/2015 Breast MRI Right breast: 1.3 x 0.6 x 0.6 cm bilobed area of nodularity in the right breast, post biopsy change   03/19/2015 Surgery Right lumpectomy: IDC 1.7 cm with DCIS, micromet in one lymph node measuring 2 mm negative for ECS, other lymph node negative, T1cN1a stage II a, ER 99%, PR 98%, HER-2 negative, Ki-67 12%, Mammaprint low risk luminal A   05/05/2015 - 06/05/2015 Radiation Therapy adjuvant radiation therapy   06/19/2015 -  Anti-estrogen oral therapy Anastrozole 1 mg daily    CHIEF COMPLIANT: follow-up on anastrozole   INTERVAL HISTORY: [name] is a 74 year old with above-mentioned history of right breast cancer currently on hormonal therapy penicillin. She is tolerating it fairly well. She does have occasional hot flashes. Proximal muscles occasionally hurt. But she had a recent knee replacement surgery. She is staying active. Denies any nausea or vomiting. Occasionally she has hot flashes. She complains of dry mouth. But it is not significant.  REVIEW OF SYSTEMS:   Constitutional: Denies fevers, chills or abnormal weight loss Eyes: Denies blurriness of vision Ears, nose, mouth, throat, and face: Denies mucositis or sore throat Respiratory: Denies cough, dyspnea or wheezes Cardiovascular: Denies palpitation, chest discomfort or lower extremity swelling Gastrointestinal:  Denies nausea, heartburn or change in bowel habits Skin: Denies abnormal skin rashes Lymphatics: Denies new lymphadenopathy or easy bruising Neurological:Denies numbness, tingling or new  weaknesses Behavioral/Psych: Mood is stable, no new changes  Breast:  denies any pain or lumps or nodules in either breasts All other systems were reviewed with the patient and are negative.  I have reviewed the past medical history, past surgical history, social history and family history with the patient and they are unchanged from previous note.  ALLERGIES:  has No Known Allergies.  MEDICATIONS:  Current Outpatient Prescriptions  Medication Sig Dispense Refill  . anastrozole (ARIMIDEX) 1 MG tablet Take 1 tablet (1 mg total) by mouth daily. 90 tablet 3  . aspirin EC 81 MG tablet Take 1 tablet (81 mg total) by mouth daily. 30 tablet 1  . bimatoprost (LUMIGAN) 0.01 % SOLN 1 drop at bedtime.    . Calcium-Vitamin D (CALTRATE 600 PLUS-VIT D PO) Take 2 tablets by mouth every evening.     . docusate sodium (COLACE) 100 MG capsule Take 100 mg by mouth daily as needed for mild constipation.    . dorzolamide-timolol (COSOPT) 22.3-6.8 MG/ML ophthalmic solution Place into both eyes 2 (two) times daily.     Marland Kitchen ezetimibe (ZETIA) 10 MG tablet Take 10 mg by mouth daily.    Marland Kitchen levothyroxine (SYNTHROID, LEVOTHROID) 112 MCG tablet Take 112 mcg by mouth daily.  11  . multivitamin (THERAGRAN) per tablet Take 1 tablet by mouth daily.     . naproxen sodium (ANAPROX) 220 MG tablet Take 220 mg by mouth as needed.    . Omega-3 Fatty Acids (FISH OIL) 1000 MG CAPS Take 2,000 capsules by mouth 2 (two) times daily.    Marland Kitchen PROCTOFOAM HC rectal foam Place 1 applicator rectally as needed.   1  . vitamin E 400 UNIT capsule Take 400 Units  by mouth every morning.     No current facility-administered medications for this visit.    PHYSICAL EXAMINATION: ECOG PERFORMANCE STATUS: 0 - Asymptomatic  Filed Vitals:   07/31/15 1403  BP: 147/62  Pulse: 78  Temp: 97.3 F (36.3 C)  Resp: 18   Filed Weights   07/31/15 1403  Weight: 194 lb 1.6 oz (88.043 kg)    GENERAL:alert, no distress and comfortable SKIN: skin color,  texture, turgor are normal, no rashes or significant lesions EYES: normal, Conjunctiva are pink and non-injected, sclera clear OROPHARYNX:no exudate, no erythema and lips, buccal mucosa, and tongue normal  NECK: supple, thyroid normal size, non-tender, without nodularity LYMPH:  no palpable lymphadenopathy in the cervical, axillary or inguinal LUNGS: clear to auscultation and percussion with normal breathing effort HEART: regular rate & rhythm and no murmurs and no lower extremity edema ABDOMEN:abdomen soft, non-tender and normal bowel sounds Musculoskeletal:no cyanosis of digits and no clubbing  NEURO: alert & oriented x 3 with fluent speech, no focal motor/sensory deficits  LABORATORY DATA:  I have reviewed the data as listed   Chemistry      Component Value Date/Time   NA 139 05/24/2013 0940   K 3.6 05/24/2013 0940   CL 105 05/24/2013 0940   CO2 27 05/24/2013 0940   BUN 22 05/24/2013 0940   CREATININE 0.83 05/24/2013 0940      Component Value Date/Time   CALCIUM 8.8 05/24/2013 0940   ALKPHOS 44 05/16/2013 1013   AST 22 05/16/2013 1013   ALT 27 05/16/2013 1013   BILITOT 0.4 05/16/2013 1013       Lab Results  Component Value Date   WBC 15.4* 06/06/2013   HGB 13.8 03/19/2015   HCT 32.1* 06/06/2013   MCV 94.1 06/06/2013   PLT 538* 06/06/2013   NEUTROABS 13.2* 06/06/2013   WallASSESSMENT & PLAN:  Breast cancer of upper-inner quadrant of right female breast Right lumpectomy 03/19/2015: IDC 1.7 cm with DCIS, micromet in one lymph node measuring 2 mm negative for ECS, other lymph node negative, T1cN1a stage II a, ER 99%, PR 98%, HER-2 negative, Ki-67 12%, Mammaprint low-risk luminal A, status post adjuvant radiation, started anastrozole 1 mg daily 06/18/2015  Anastrozole toxicities: 1. Mild hot flashes 2. Dry mouth 3. Occasional muscle pains especially in the proximal thighs  Breast cancer surveillance: 1. Periodic breast exams every 4-6 months 2. Annual  mammograms  Return to clinic in 6 months for follow-up   No orders of the defined types were placed in this encounter.   The patient has a good understanding of the overall plan. she agrees with it. she will call with any problems that may develop before the next visit here.   Rulon Eisenmenger, MD

## 2015-07-31 NOTE — Telephone Encounter (Signed)
lvm for pt regarding to April 2017 appt.Marland KitchenMarland KitchenMarland Kitchen

## 2015-08-08 ENCOUNTER — Telehealth: Payer: Self-pay | Admitting: Hematology and Oncology

## 2015-08-08 ENCOUNTER — Other Ambulatory Visit: Payer: Self-pay | Admitting: *Deleted

## 2015-08-08 DIAGNOSIS — C50211 Malignant neoplasm of upper-inner quadrant of right female breast: Secondary | ICD-10-CM

## 2015-08-08 NOTE — Telephone Encounter (Signed)
s.w. pt and advised on NOV appt...ok and aware

## 2015-09-11 ENCOUNTER — Encounter: Payer: Self-pay | Admitting: Nurse Practitioner

## 2015-09-11 ENCOUNTER — Ambulatory Visit (HOSPITAL_BASED_OUTPATIENT_CLINIC_OR_DEPARTMENT_OTHER): Payer: Medicare Other | Admitting: Nurse Practitioner

## 2015-09-11 VITALS — BP 148/62 | HR 70 | Temp 97.8°F | Resp 18 | Ht 67.0 in | Wt 195.2 lb

## 2015-09-11 DIAGNOSIS — Z170 Estrogen receptor positive status [ER+]: Secondary | ICD-10-CM | POA: Diagnosis not present

## 2015-09-11 DIAGNOSIS — C50211 Malignant neoplasm of upper-inner quadrant of right female breast: Secondary | ICD-10-CM

## 2015-09-11 NOTE — Progress Notes (Signed)
 CLINIC:  Cancer Survivorship   REASON FOR VISIT:  Routine follow-up post-treatment for a recent history of breast cancer.  BRIEF ONCOLOGIC HISTORY:    Breast cancer of upper-inner quadrant of right female breast ([facility])   02/03/2015 Mammogram Right breast: possible abnormality requiring further imaging   02/06/2015 Breast US Right breast: no correlate seen   02/20/2015 Initial Biopsy Right breast core needle bx: Invasive ductal carcinoma, grade 1, with DCIS; ER+ (99%), PR+ (98%), HER2/neu negative (ratio 1.71), Ki67 12%.   02/25/2015 Breast MRI Right breast: 1.3 x 0.6 x 0.6 cm bilobed area of nodularity in the right breast, post biopsy change   02/25/2015 Clinical Stage Stage IA: T1c N0   03/19/2015 Definitive Surgery Right lumpectomy / SLNB (Toth): IDC 1.7 cm with DCIS, micromet in one lymph node measuring 2 mm negative for ECS, other lymph node negative;  ER+ (99%), PR+ (98%), HER-2 negative, Ki-67 12%   03/19/2015 Pathologic Stage Stage IIA: pT1c pN1a    03/19/2015 Procedure Mammaprint low risk luminal A   05/05/2015 - 06/05/2015 Radiation Therapy Adjuvant RT (Murray): Right breast 48 Gy over 24 fractions    06/19/2015 -  Anti-estrogen oral therapy Anastrozole 1 mg daily. Planned duration of therapy 5 years.    INTERVAL HISTORY:  Ms. Ducat presents to the Survivorship Clinic today for our initial meeting to review her survivorship care plan detailing her treatment course for breast cancer, as well as monitoring long-term side effects of that treatment, education regarding health maintenance, screening, and overall wellness and health promotion.     Overall, Ms. Jourdan reports feeling quite well since completing her radiation therapy approximately three months ago. She reports that her skin changes have resolved and she is feeling good.  She has some intermittent pain in her right axilla that resolves without intervention. It occurs intermittently and lasts for a few moments.  She denies any mass  or nodularity in either breast.  She reports a good appetite and denies any weight loss. She has had no headache, cough, shortness of breath, or pain. She is taking her anastrozole and tolerating it well without significant side effects.    REVIEW OF SYSTEMS:  General: Denies fever, chills, unintentional weight loss, or generalized fatigue.  Cardiac: Denies palpitations, chest pain, and lower extremity edema.  Respiratory: Denies dyspnea on exertion.  GI: Occasional constipation. Denies abdominal pain, diarrhea, nausea, or vomiting.  GU: Denies dysuria, hematuria, vaginal bleeding, vaginal discharge, or vaginal dryness.  Musculoskeletal: Denies joint or bone pain.  Neuro: Occasional neuropathy in her hands, right greater than left, and spends a great deal of time crocheting. Denies recent falls.  Skin: Denies rash, pruritis, or open wounds.  Breast: Denies any new nodularity, masses, tenderness, nipple changes, or nipple discharge.  Psych: Denies depression, anxiety, insomnia, or memory loss.   A 14-point review of systems was completed and was negative, except as noted above.   ONCOLOGY TREATMENT TEAM:  1. Surgeon:  Dr. Toth at [facility]  2. Medical Oncologist: Dr. Gudena 3. Radiation Oncologist: Dr. Murray    PAST MEDICAL/SURGICAL HISTORY:  Past Medical History  Diagnosis Date  . Hypothyroidism   . Arthritis   . Glaucoma   . Breast cancer ([facility])     right breast  . HOH (hard of hearing)   . Blood transfusion without reported diagnosis   . S/P radiation therapy 05/05/2015 through 06/05/2015    Right breast (high tangents) 4800 cGy in 24 sessions , no boost       Past Surgical History  Procedure Laterality Date  . Abdominal hysterectomy    . Appendectomy    . Cholecystectomy    . Tubal ligation    . Tonsillectomy    . Anterior and posterior repair  06/10/2011    Procedure: ANTERIOR (CYSTOCELE) AND POSTERIOR REPAIR (RECTOCELE);  Surgeon:  Blane Ohara Meisinger;  Location: [facility];  Service: Gynecology;  Laterality: N/A;  . Vaginal prolapse repair  06/10/2011    Procedure: VAGINAL VAULT SUSPENSION;  Surgeon: Blane Ohara Meisinger;  Location: [facility];  Service: Gynecology;  Laterality: N/A;  . Carpal tunnel release      LEFT   . Ankle surgery      RIGHT   . Hammer toe surgery      LEFT  . Knee arthroscopy      RIGHT  . Total knee arthroplasty Right 05/23/2013    Procedure: Right Total Knee Arthroplasty;  Surgeon: Newt Minion, MD;  Location: [facility];  Service: Orthopedics;  Laterality: Right;  Right Total Knee Arthroplasty  . Joint replacement      rt  . Radioactive seed guided mastectomy with axillary sentinel lymph node biopsy Right 03/19/2015    Procedure: RIGHT BREAST LUMPECTOMY WITH RADIOACTIVE SEED AND SENTINEL LYMPH NODE MAPPING;  Surgeon: Autumn Messing III, MD;  Location: [facility];  Service: General;  Laterality: Right;     ALLERGIES:  No Known Allergies   CURRENT MEDICATIONS:  Current Outpatient Prescriptions on File Prior to Visit  Medication Sig Dispense Refill  . anastrozole (ARIMIDEX) 1 MG tablet Take 1 tablet (1 mg total) by mouth daily. 90 tablet 3  . aspirin EC 81 MG tablet Take 1 tablet (81 mg total) by mouth daily. 30 tablet 1  . bimatoprost (LUMIGAN) 0.01 % SOLN 1 drop at bedtime.    . Calcium-Vitamin D (CALTRATE 600 PLUS-VIT D PO) Take 2 tablets by mouth every evening.     . dorzolamide-timolol (COSOPT) 22.3-6.8 MG/ML ophthalmic solution Place into both eyes 2 (two) times daily.     Marland Kitchen ezetimibe (ZETIA) 10 MG tablet Take 10 mg by mouth daily.    Marland Kitchen levothyroxine (SYNTHROID, LEVOTHROID) 112 MCG tablet Take 112 mcg by mouth daily.  11  . multivitamin (THERAGRAN) per tablet Take 1 tablet by mouth daily.     . Omega-3 Fatty Acids (FISH OIL) 1000 MG CAPS Take 2,000 capsules by mouth 2 (two) times daily.    Marland Kitchen PROCTOFOAM HC rectal foam Place 1 applicator rectally as needed.   1  . vitamin E 400 UNIT capsule  Take 400 Units by mouth every morning.    . docusate sodium (COLACE) 100 MG capsule Take 100 mg by mouth daily as needed for mild constipation.     No current facility-administered medications on file prior to visit.     ONCOLOGIC FAMILY HISTORY:  Family History  Problem Relation Age of Onset  . Adopted: Yes     GENETIC COUNSELING/TESTING: None (she is adopted)  SOCIAL HISTORY:  SIHAAM CHROBAK is married and lives with her spouse in [place], [place].  She has 1 child.  Ms. Mcneely is currently not working.  She denies any current or history of tobacco, alcohol, or illicit drug use.     PHYSICAL EXAMINATION:  Vital Signs:   Filed Vitals:   09/11/15 1001  BP: 148/62  Pulse: 70  Temp: 97.8 F (36.6 C)  Resp: 18   General: Well-nourished, well-appearing female in no acute distress.  She is  accompanied in clinic by her husband today.   HEENT: Head is atraumatic and normocephalic.  Pupils equal and reactive to light and accomodation. Conjunctivae clear without exudate.  Sclerae anicteric. Oral mucosa is pink, moist, and intact without lesions.  Oropharynx is pink without lesions or erythema.  Lymph: No cervical, supraclavicular, infraclavicular, or axillary lymphadenopathy noted on palpation.  Cardiovascular: Regular rate and rhythm without murmurs, rubs, or gallops. Respiratory: Clear to auscultation bilaterally. Chest expansion symmetric without accessory muscle use on inspiration or expiration.  Breast: Bilateral breast exam performed.  No mass or nodularity in either breast.  Expected post radiation thickening in her right breast.   GI: Abdomen soft and round. No tenderness to palpation. Bowel sounds normoactive in 4 quadrants.   GU: Deferred.  Neuro: No focal deficits. Steady gait.  Psych: Mood and affect normal and appropriate for situation.  Extremities: No edema, cyanosis, or clubbing.  Skin: Warm and dry. No open lesions noted.   LABORATORY DATA:  None  for this visit.  DIAGNOSTIC IMAGING:  None for this visit.     ASSESSMENT AND PLAN:   1. History of breast cancer: Stage IA invasive ductal carcinoma of the right breast, diagnosed April 2016, S/P lumpectomy and adjuvant radiation therapy, now on adjuvant endocrine therapy with anastrozole.  Ms. Arkin is doing well without clinical evidence concerning for disease recurrence at this time.  She will follow-up with her medical oncologist,  Dr. Gudena, in April 2017 with history and physical exam per surveillance protocol.  She will also see Dr. Toth later this spring.  She will continue her anti-estrogen therapy with anastrozole as prescribed by  Dr. Gudena. She was instructed to make Dr. Gudena or myself aware if she begins to experience any side effects of the medication and I could see her back in clinic to help manage those side effects, as needed. A comprehensive survivorship care plan and treatment summary was reviewed with the patient today detailing her breast cancer diagnosis, treatment course, potential late/long-term effects of treatment, appropriate follow-up care with recommendations for the future, and patient education resources.  A copy of this summary, along with a letter will be sent to the patient's primary care provider via in basket message after today's visit.  Ms. Shearer is welcome to return to the Survivorship Clinic in the future, as needed; no follow-up will be scheduled at this time.    2. Bone health:  Given Ms. Ruble's age/history of breast cancer and her current treatment regimen including anti-estrogen therapy with anastrozole, she is at risk for bone demineralization.  Per her report, her last DEXA scan was performed at Dr. Shaw's office 03/12/2015 revealing osteopenia. I will attempt to secure a copy of those results and we will continue to monitor this closely moving forward.  In the meantime, she was encouraged to increase her consumption of foods rich in calcium and  vitamin D as well as to increase her weight-bearing activities.  She was given education on specific activities to promote bone health.  3. Cancer screening:  Due to Ms. Laufer's history and her age, she should receive screening for skin cancers and colon cancer (she has undergone hysterectomy).  The information and recommendations are listed on the patient's comprehensive care plan/treatment summary and were reviewed in detail with the patient.    4. Health maintenance and wellness promotion: Ms. Manke was encouraged to consume 5-7 servings of fruits and vegetables per day. We reviewed the "Nutrition Rainbow" handout, as well discussed specific recommendations   to maximize nutrition and reduce cancer recurrence including limiting intake of red meat and processed foods. She was also encouraged to engage in moderate to vigorous exercise for 30 minutes per day most days of the week. We discussed the [employer] fitness program, which is designed for cancer survivors to help them become more physically fit after cancer treatments.  She was instructed to continue to abstain from tobacco and alcohol use, although occasional alcohol use is acceptable.   5. Support services/counseling: It is not uncommon for this period of the patient's cancer care trajectory to be one of many emotions and stressors.  We discussed an opportunity for her to participate in the next session of FYNN ("Finding Your New Normal") support group series designed for patients after they have completed treatment.   Ms. Muccio was encouraged to take advantage of our many other support services programs, support groups, and/or counseling in coping with her new life as a cancer survivor after completing anti-cancer treatment.  She was offered support today through active listening and expressive supportive counseling.  She was given information regarding our available services and encouraged to contact me with any questions or for help  enrolling in any of our support group/programs.    A total of 50 minutes of face-to-face time was spent with this patient with greater than 50% of that time in counseling and care-coordination.   Heather Thompson Mackey, NP  Survivorship Program [facility] [phone]   Note: PRIMARY CARE PROVIDER SHAW,KIMBERLEE, MD [phone]    

## 2015-10-23 ENCOUNTER — Encounter: Payer: Self-pay | Admitting: Adult Health

## 2015-10-23 NOTE — Progress Notes (Signed)
A birthday card was mailed to the patient today on behalf of the Survivorship Program at [facility].   Stasha Naraine, NP [facility] [phone]  

## 2015-12-31 ENCOUNTER — Other Ambulatory Visit: Payer: Self-pay | Admitting: Family Medicine

## 2015-12-31 ENCOUNTER — Ambulatory Visit
Admission: RE | Admit: 2015-12-31 | Discharge: 2015-12-31 | Disposition: A | Discharge status: Home / Self Care | Payer: Medicare Other | Source: Ambulatory Visit | Attending: Family Medicine | Admitting: Family Medicine

## 2015-12-31 DIAGNOSIS — M25552 Pain in left hip: Secondary | ICD-10-CM

## 2016-01-19 ENCOUNTER — Other Ambulatory Visit: Payer: Self-pay | Admitting: General Surgery

## 2016-01-19 NOTE — H&P (Signed)
History of Present Illness Pamela Ruff MD; 0000000 10:52 AM) The patient is a 75 year old female who presents with hemorrhoids. We are asked to see the patient in consultation by Dr. Serita Grammes to evaluate her for hemorrhoids. The patient is a 75 year old white female who is a previous breast cancer patient of Dr Marlou Starks. Over the last 2-3 months she has been experiencing bleeding with bowel movements. She denies any rectal pain. She has been taking MiraLAX and her bowels have been moving more regularly. She underwent 2 episodes of rubber band ligation approximately one month apart. This seemed to resolve her symptoms at first. Over the last couple weeks she has developed new rectal bleeding and is here for follow-up.   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 Tonsillectomy Knee Surgery Right. Appendectomy  Diagnostic Studies History Pamela Ruff, MD; 0000000 10:53 AM) Mammogram within last year Colonoscopy 1-5 years ago  Allergies [name], [name]; 01/19/2016 10:37 AM) No Known Drug Allergies 02/28/2015  Medication History [name], [name]; 01/19/2016 10:37 AM) [name] (Oral) Active. MiraLax (Oral) Active. Anastrozole (1MG  Tablet, Oral) Active. Dorzolamide HCl-Timolol Mal (22.3-6.8MG /ML Solution, Ophthalmic) Active. Vitamin D (Cholecalciferol) (400UNIT Tablet Chewable, Oral) Active. Aspirin EC (81MG  Tablet DR, Oral) Active. Lumigan (0.01% Solution, Ophthalmic) Active. Zetia (10MG  Tablet, Oral) Active. Levothyroxine Sodium (112MCG Tablet, Oral) Active. Fish Oil Active. Vitamin E  Complex (1000UNIT Capsule, Oral) Active. Meloxicam (15MG  Tablet, Oral) Active. Medications Reconciled  Social History Pamela Ruff, MD; 0000000 10:54 AM) Tobacco use Never smoker. No alcohol use  Pregnancy / Birth History Pamela Ruff, MD; 0000000 10:54 AM) Age at menarche 53 years. Age of menopause <45 Maternal age 81-35 Para 52 Gravida 1    5 [name] [name]; 01/19/2016 10:37 AM) 01/19/2016 10:37 AM Weight: 196.8 lb Height: 67in Body Surface Area: 2.01 m Body Mass Index: 30.82 kg/m  Temp.: 97.92F  Pulse: 80 (Regular)  BP: 126/74 (Sitting, Left Arm, Standard)      Physical Exam Pamela Ruff MD; 0000000 10:53 AM)  General Mental Status-Alert. General Appearance-Consistent with stated age. Hydration-Well hydrated. Voice-Normal.  Head and Neck Head-normocephalic, atraumatic with no lesions or palpable masses. Trachea-midline.  Eye Eyeball - Bilateral-Extraocular movements intact. Sclera/Conjunctiva - Bilateral-No scleral icterus.  Chest and Lung Exam Chest and lung exam reveals -quiet, even and easy respiratory effort with no use of accessory muscles and on auscultation, normal breath sounds, no adventitious sounds and normal vocal resonance. Inspection Chest Wall - Normal. Back - normal.  Cardiovascular Cardiovascular examination reveals -normal heart sounds, regular rate and rhythm with no murmurs.  Abdomen Inspection Inspection of the abdomen reveals - No Hernias. Skin - Scar - no surgical scars. Palpation/Percussion Palpation and Percussion of the abdomen reveal - Soft, Non Tender, No Rebound tenderness, No Rigidity (guarding) and No hepatosplenomegaly. Auscultation Auscultation of the abdomen reveals - Bowel sounds normal.  Rectal Note: The patient has a left lateral internal and external hemorrhoid that appears to be bleeding. Minimal other external disease noted.  Neurologic Neurologic  evaluation reveals -alert and oriented x 3 with no impairment of recent or remote memory. Mental Status-Normal.  Musculoskeletal Normal Exam - Left-Upper Extremity Strength Normal and Lower Extremity Strength Normal. Normal Exam - Right-Upper Extremity Strength  Normal and Lower Extremity Strength Normal.  Lymphatic Head & Neck  General Head & Neck Lymphatics: Bilateral - Description - Normal. Axillary  General Axillary Region: Bilateral - Description - Normal. Tenderness - Non Tender. Femoral & Inguinal  Generalized Femoral & Inguinal Lymphatics: Bilateral - Description - Normal. Tenderness - Non Tender.    Assessment & Plan Pamela Ruff MD; 0000000 10:51 AM)  INTERNAL HEMORRHOID, BLEEDING (K64.8) Impression: 75 year old female who is status post several episodes of rubber band ligation and presents with a new area of rectal bleeding approximately 2 months after her last rubber band ligation. On exam today she has an external and internal component that appear to be bleeding. I do not think the rubber band ligation would resolve the issue. I have recommended hemorrhoidectomy. We will plan on doing a hemorrhoidal pexy of any other areas that could be causing bleeding as well.

## 2016-01-29 ENCOUNTER — Ambulatory Visit (HOSPITAL_BASED_OUTPATIENT_CLINIC_OR_DEPARTMENT_OTHER): Payer: Medicare Other | Admitting: Hematology and Oncology

## 2016-01-29 ENCOUNTER — Encounter (HOSPITAL_BASED_OUTPATIENT_CLINIC_OR_DEPARTMENT_OTHER): Payer: Self-pay | Admitting: *Deleted

## 2016-01-29 ENCOUNTER — Encounter: Payer: Self-pay | Admitting: Hematology and Oncology

## 2016-01-29 VITALS — BP 149/65 | HR 73 | Temp 97.5°F | Resp 18 | Ht 68.0 in | Wt 198.1 lb

## 2016-01-29 DIAGNOSIS — R682 Dry mouth, unspecified: Secondary | ICD-10-CM | POA: Diagnosis not present

## 2016-01-29 DIAGNOSIS — Z170 Estrogen receptor positive status [ER+]: Secondary | ICD-10-CM | POA: Diagnosis not present

## 2016-01-29 DIAGNOSIS — C50211 Malignant neoplasm of upper-inner quadrant of right female breast: Secondary | ICD-10-CM

## 2016-01-29 DIAGNOSIS — C773 Secondary and unspecified malignant neoplasm of axilla and upper limb lymph nodes: Secondary | ICD-10-CM | POA: Diagnosis not present

## 2016-01-29 NOTE — Assessment & Plan Note (Signed)
Right lumpectomy 03/19/2015: IDC 1.7 cm with DCIS, micromet in one lymph node measuring 2 mm negative for ECS, other lymph node negative, T1cN1a stage II a, ER 99%, PR 98%, HER-2 negative, Ki-67 12%, Mammaprint low-risk luminal A, status post adjuvant radiation, started anastrozole 1 mg daily 06/18/2015  Anastrozole toxicities: 1. Mild hot flashes 2. Dry mouth 3. Occasional muscle pains especially in the proximal thighs  Breast cancer surveillance: 1. Periodic breast exams every 4-6 months 2. Annual mammograms  Return to clinic in 6 months for follow-up

## 2016-01-29 NOTE — Progress Notes (Signed)
NPO AFTER MN.  ARRIVE AT 1030.  NEEDS HG.  WILL TAKE SYNTHROID AM DOS W/ SIPS OF WATER.

## 2016-01-29 NOTE — Progress Notes (Signed)
Unable to get in to exam room prior to MD.  No assessment performed.  

## 2016-01-29 NOTE — Progress Notes (Signed)
Patient Care Team: Mayra Neer, MD as PCP - General (Family Medicine) Nicholas Lose, MD as Consulting Physician (Hematology and Oncology) Autumn Messing III, MD as Consulting Physician (General Surgery) Arloa Koh, MD as Consulting Physician (Radiation Oncology) Sylvan Cheese, NP as Nurse Practitioner (Hematology and Oncology)  DIAGNOSIS: Breast cancer of upper-inner quadrant of right female breast [facility])   Staging form: Breast, AJCC 7th Edition     Clinical stage from 02/25/2015: Stage 0 (Tis (DCIS), N0, M0) - Unsigned     Pathologic stage from 03/19/2015: Stage 0 (Tis (DCIS), N0, cM0) - Unsigned   SUMMARY OF ONCOLOGIC HISTORY:   Breast cancer of upper-inner quadrant of right female breast ([place])   02/03/2015 Mammogram Right breast: possible abnormality requiring further imaging   02/06/2015 Breast US Right breast: no correlate seen   02/20/2015 Initial Biopsy Right breast core needle bx: Invasive ductal carcinoma, grade 1, with DCIS; ER+ (99%), PR+ (98%), HER2/neu negative (ratio 1.71), Ki67 12%.   02/25/2015 Breast MRI Right breast: 1.3 x 0.6 x 0.6 cm bilobed area of nodularity in the right breast, post biopsy change   02/25/2015 Clinical Stage Stage IA: T1c N0   03/19/2015 Definitive Surgery Right lumpectomy / [facility] Marlou Starks): IDC 1.7 cm with DCIS, micromet in one lymph node measuring 2 mm negative for ECS, other lymph node negative;  ER+ (99%), PR+ (98%), HER-2 negative, Ki-67 12%   03/19/2015 Pathologic Stage Stage IIA: pT1c pN1a    03/19/2015 Procedure Mammaprint low risk luminal A   05/05/2015 - 06/05/2015 Radiation Therapy Adjuvant RT Valere Dross): Right breast 48 Gy over 24 fractions    06/19/2015 -  Anti-estrogen oral therapy Anastrozole 1 mg daily. Planned duration of therapy 5 years.   09/11/2015 Survivorship Survivorship visit completed and copy of care plan provided to patient    CHIEF COMPLIANT: follow-up on anastrozole  INTERVAL HISTORY: [name] is a 75 year old with  above-mentioned C right breast cancer treated with lumpectomy followed by adjuvant radiation and is now on anastrozole therapy. She appears to be tolerating anastrozole extremely well. She previously had mild hot flashes and myalgias which have both resolved. She needs a mammogram but has not been scheduled yet.  REVIEW OF SYSTEMS:   Constitutional: Denies fevers, chills or abnormal weight loss Eyes: Denies blurriness of vision Ears, nose, mouth, throat, and face: Denies mucositis or sore throat Respiratory: Denies cough, dyspnea or wheezes Cardiovascular: Denies palpitation, chest discomfort Gastrointestinal:  Denies nausea, heartburn or change in bowel habits Skin: Denies abnormal skin rashes Lymphatics: Denies new lymphadenopathy or easy bruising Neurological:Denies numbness, tingling or new weaknesses Behavioral/Psych: Mood is stable, no new changes  Extremities: No lower extremity edema Breast:  denies any pain or lumps or nodules in either breasts All other systems were reviewed with the patient and are negative.  I have reviewed the past medical history, past surgical history, social history and family history with the patient and they are unchanged from previous note.  ALLERGIES:  is allergic to statins.  MEDICATIONS:  Current Outpatient Prescriptions  Medication Sig Dispense Refill  . anastrozole (ARIMIDEX) 1 MG tablet Take 1 tablet (1 mg total) by mouth daily. (Patient taking differently: Take 1 mg by mouth every morning. ) 90 tablet 3  . aspirin EC 81 MG tablet Take 1 tablet (81 mg total) by mouth daily. 30 tablet 1  . bimatoprost (LUMIGAN) 0.01 % SOLN Place 1 drop into both eyes at bedtime.     . Calcium-Vitamin D (CALTRATE 600 PLUS-VIT D  PO) Take 2 tablets by mouth every evening.     . docusate sodium (COLACE) 100 MG capsule Take 100 mg by mouth daily as needed for mild constipation.    . dorzolamide-timolol (COSOPT) 22.3-6.8 MG/ML ophthalmic solution Place 1 drop into both  eyes 2 (two) times daily.     Marland Kitchen ezetimibe (ZETIA) 10 MG tablet Take 10 mg by mouth every evening.     Marland Kitchen levothyroxine (SYNTHROID, LEVOTHROID) 112 MCG tablet Take 112 mcg by mouth daily before breakfast.   11  . multivitamin (THERAGRAN) per tablet Take 1 tablet by mouth daily.     . Omega-3 Fatty Acids (FISH OIL) 1000 MG CAPS Take 2,000 capsules by mouth 2 (two) times daily.    . polyethylene glycol (MIRALAX / GLYCOLAX) packet Take 17 g by mouth daily.    Marland Kitchen PROCTOFOAM HC rectal foam Place 1 applicator rectally as needed.   1  . vitamin E 400 UNIT capsule Take 400 Units by mouth every morning.     No current facility-administered medications for this visit.    PHYSICAL EXAMINATION: ECOG PERFORMANCE STATUS: 0 - Asymptomatic  Filed Vitals:   01/29/16 1407  BP: 149/65  Pulse: 73  Temp: 97.5 F (36.4 C)  Resp: 18   Filed Weights   01/29/16 1407  Weight: 198 lb 1.6 oz (89.858 kg)    GENERAL:alert, no distress and comfortable SKIN: skin color, texture, turgor are normal, no rashes or significant lesions EYES: normal, Conjunctiva are pink and non-injected, sclera clear OROPHARYNX:no exudate, no erythema and lips, buccal mucosa, and tongue normal  NECK: supple, thyroid normal size, non-tender, without nodularity LYMPH:  no palpable lymphadenopathy in the cervical, axillary or inguinal LUNGS: clear to auscultation and percussion with normal breathing effort HEART: regular rate & rhythm and no murmurs and no lower extremity edema ABDOMEN:abdomen soft, non-tender and normal bowel sounds MUSCULOSKELETAL:no cyanosis of digits and no clubbing  NEURO: alert & oriented x 3 with fluent speech, no focal motor/sensory deficits EXTREMITIES: No lower extremity edema BREAST: No palpable masses or nodules in either right or left breasts. No palpable axillary supraclavicular or infraclavicular adenopathy no breast tenderness or nipple discharge. (exam performed in the presence of a  chaperone)  LABORATORY DATA:  I have reviewed the data as listed   Chemistry      Component Value Date/Time   NA 139 05/24/2013 0940   K 3.6 05/24/2013 0940   CL 105 05/24/2013 0940   CO2 27 05/24/2013 0940   BUN 22 05/24/2013 0940   CREATININE 0.83 05/24/2013 0940      Component Value Date/Time   CALCIUM 8.8 05/24/2013 0940   ALKPHOS 44 05/16/2013 1013   AST 22 05/16/2013 1013   ALT 27 05/16/2013 1013   BILITOT 0.4 05/16/2013 1013       Lab Results  Component Value Date   WBC 15.4* 06/06/2013   HGB 13.8 03/19/2015   HCT 32.1* 06/06/2013   MCV 94.1 06/06/2013   PLT 538* 06/06/2013   NEUTROABS 13.2* 06/06/2013     ASSESSMENT & PLAN:  Breast cancer of upper-inner quadrant of right female breast Right lumpectomy 03/19/2015: IDC 1.7 cm with DCIS, micromet in one lymph node measuring 2 mm negative for ECS, other lymph node negative, T1cN1a stage II a, ER 99%, PR 98%, HER-2 negative, Ki-67 12%, Mammaprint low-risk luminal A, status post adjuvant radiation, started anastrozole 1 mg daily 06/18/2015  Anastrozole toxicities: 1. Heart flashes resolved 2. Dry mouth 3. Occasional muscle pains also  resolved  Breast cancer surveillance: 1. Breast exam 01/29/2016: No palpable lumps or nodules 2. Annual mammograms will need to be set up.  Return to clinic in 6 months for follow-up   No orders of the defined types were placed in this encounter.   The patient has a good understanding of the overall plan. she agrees with it. she will call with any problems that may develop before the next visit here.   Rulon Eisenmenger, MD 01/29/2016

## 2016-01-30 ENCOUNTER — Ambulatory Visit (HOSPITAL_BASED_OUTPATIENT_CLINIC_OR_DEPARTMENT_OTHER): Payer: Medicare Other | Admitting: Certified Registered"

## 2016-01-30 ENCOUNTER — Encounter (HOSPITAL_BASED_OUTPATIENT_CLINIC_OR_DEPARTMENT_OTHER): Payer: Self-pay | Admitting: Certified Registered"

## 2016-01-30 ENCOUNTER — Encounter (HOSPITAL_BASED_OUTPATIENT_CLINIC_OR_DEPARTMENT_OTHER)
Admission: RE | Disposition: A | Discharge status: Home / Self Care | Payer: Self-pay | Source: Ambulatory Visit | Attending: General Surgery

## 2016-01-30 ENCOUNTER — Telehealth: Payer: Self-pay | Admitting: Hematology and Oncology

## 2016-01-30 ENCOUNTER — Ambulatory Visit (HOSPITAL_BASED_OUTPATIENT_CLINIC_OR_DEPARTMENT_OTHER)
Admission: RE | Admit: 2016-01-30 | Discharge: 2016-01-30 | Disposition: A | Discharge status: Home / Self Care | Payer: Medicare Other | Source: Ambulatory Visit | Attending: General Surgery | Admitting: General Surgery

## 2016-01-30 DIAGNOSIS — E669 Obesity, unspecified: Secondary | ICD-10-CM | POA: Insufficient documentation

## 2016-01-30 DIAGNOSIS — Z6829 Body mass index (BMI) 29.0-29.9, adult: Secondary | ICD-10-CM | POA: Insufficient documentation

## 2016-01-30 DIAGNOSIS — E039 Hypothyroidism, unspecified: Secondary | ICD-10-CM | POA: Insufficient documentation

## 2016-01-30 DIAGNOSIS — Z853 Personal history of malignant neoplasm of breast: Secondary | ICD-10-CM | POA: Diagnosis not present

## 2016-01-30 DIAGNOSIS — Z79899 Other long term (current) drug therapy: Secondary | ICD-10-CM | POA: Diagnosis not present

## 2016-01-30 DIAGNOSIS — Z7982 Long term (current) use of aspirin: Secondary | ICD-10-CM | POA: Insufficient documentation

## 2016-01-30 DIAGNOSIS — K648 Other hemorrhoids: Secondary | ICD-10-CM | POA: Diagnosis present

## 2016-01-30 DIAGNOSIS — H409 Unspecified glaucoma: Secondary | ICD-10-CM | POA: Diagnosis not present

## 2016-01-30 HISTORY — DX: Presence of external hearing-aid: Z97.4

## 2016-01-30 HISTORY — DX: Personal history of other specified conditions: Z87.898

## 2016-01-30 HISTORY — DX: Other hemorrhoids: K64.8

## 2016-01-30 HISTORY — DX: Atrioventricular block, first degree: I44.0

## 2016-01-30 HISTORY — PX: EVALUATION UNDER ANESTHESIA WITH HEMORRHOIDECTOMY: SHX5624

## 2016-01-30 HISTORY — DX: Residual hemorrhoidal skin tags: K64.4

## 2016-01-30 HISTORY — DX: Postprocedural hypothyroidism: E89.0

## 2016-01-30 HISTORY — DX: Unspecified glaucoma: H40.9

## 2016-01-30 HISTORY — DX: Presence of spectacles and contact lenses: Z97.3

## 2016-01-30 LAB — POCT HEMOGLOBIN-HEMACUE: Hemoglobin: 14 g/dL (ref 12.0–15.0)

## 2016-01-30 SURGERY — EXAM UNDER ANESTHESIA WITH HEMORRHOIDECTOMY
Anesthesia: General

## 2016-01-30 MED ORDER — SODIUM CHLORIDE 0.9 % IV SOLN
250.0000 mL | INTRAVENOUS | Status: DC | PRN
  Filled 2016-01-30: qty 250

## 2016-01-30 MED ORDER — FENTANYL CITRATE (PF) 100 MCG/2ML IJ SOLN
INTRAMUSCULAR | Status: DC | PRN
  Administered 2016-01-30: 50 ug via INTRAVENOUS

## 2016-01-30 MED ORDER — HYDROCODONE-ACETAMINOPHEN 5-325 MG PO TABS: 1.0000 | ORAL_TABLET | ORAL | Status: DC | PRN

## 2016-01-30 MED ORDER — FENTANYL CITRATE (PF) 100 MCG/2ML IJ SOLN
25.0000 ug | INTRAMUSCULAR | Status: DC | PRN
  Filled 2016-01-30: qty 1

## 2016-01-30 MED ORDER — DEXAMETHASONE SODIUM PHOSPHATE 10 MG/ML IJ SOLN
INTRAMUSCULAR | Status: AC
  Filled 2016-01-30: qty 1

## 2016-01-30 MED ORDER — ONDANSETRON HCL 4 MG/2ML IJ SOLN
INTRAMUSCULAR | Status: DC | PRN
  Administered 2016-01-30: 4 mg via INTRAVENOUS

## 2016-01-30 MED ORDER — ACETAMINOPHEN 650 MG RE SUPP
650.0000 mg | RECTAL | Status: DC | PRN
  Filled 2016-01-30: qty 1

## 2016-01-30 MED ORDER — BUPIVACAINE LIPOSOME 1.3 % IJ SUSP
INTRAMUSCULAR | Status: DC | PRN
  Administered 2016-01-30: 20 mL

## 2016-01-30 MED ORDER — MIDAZOLAM HCL 2 MG/2ML IJ SOLN
INTRAMUSCULAR | Status: AC
  Filled 2016-01-30: qty 2

## 2016-01-30 MED ORDER — ACETAMINOPHEN 325 MG PO TABS
650.0000 mg | ORAL_TABLET | ORAL | Status: DC | PRN
  Filled 2016-01-30: qty 2

## 2016-01-30 MED ORDER — SODIUM CHLORIDE 0.9% FLUSH
3.0000 mL | Freq: Two times a day (BID) | INTRAVENOUS | Status: DC
  Filled 2016-01-30: qty 3

## 2016-01-30 MED ORDER — PROPOFOL 10 MG/ML IV BOLUS
INTRAVENOUS | Status: AC
  Filled 2016-01-30: qty 20

## 2016-01-30 MED ORDER — DEXAMETHASONE SODIUM PHOSPHATE 4 MG/ML IJ SOLN
INTRAMUSCULAR | Status: DC | PRN
  Administered 2016-01-30: 10 mg via INTRAVENOUS

## 2016-01-30 MED ORDER — PROPOFOL 500 MG/50ML IV EMUL
INTRAVENOUS | Status: DC | PRN
  Administered 2016-01-30: 140 ug/kg/min via INTRAVENOUS

## 2016-01-30 MED ORDER — LIDOCAINE 5 % EX OINT
TOPICAL_OINTMENT | CUTANEOUS | Status: DC | PRN
  Administered 2016-01-30: 1

## 2016-01-30 MED ORDER — MIDAZOLAM HCL 5 MG/5ML IJ SOLN
INTRAMUSCULAR | Status: DC | PRN
  Administered 2016-01-30: 1 mg via INTRAVENOUS

## 2016-01-30 MED ORDER — SODIUM CHLORIDE 0.9% FLUSH
3.0000 mL | INTRAVENOUS | Status: DC | PRN
  Filled 2016-01-30: qty 3

## 2016-01-30 MED ORDER — OXYCODONE HCL 5 MG PO TABS
5.0000 mg | ORAL_TABLET | ORAL | Status: DC | PRN
  Filled 2016-01-30: qty 2

## 2016-01-30 MED ORDER — BUPIVACAINE-EPINEPHRINE 0.5% -1:200000 IJ SOLN
INTRAMUSCULAR | Status: DC | PRN
  Administered 2016-01-30: 20 mL

## 2016-01-30 MED ORDER — LACTATED RINGERS IV SOLN
INTRAVENOUS | Status: DC
  Administered 2016-01-30: 11:00:00 via INTRAVENOUS
  Filled 2016-01-30: qty 1000

## 2016-01-30 MED ORDER — ONDANSETRON HCL 4 MG/2ML IJ SOLN
INTRAMUSCULAR | Status: AC
  Filled 2016-01-30: qty 2

## 2016-01-30 MED ORDER — FENTANYL CITRATE (PF) 100 MCG/2ML IJ SOLN
INTRAMUSCULAR | Status: AC
  Filled 2016-01-30: qty 2

## 2016-01-30 SURGICAL SUPPLY — 43 items
BLADE HEX COATED 2.75 (ELECTRODE) ×3 IMPLANT
BLADE SURG 10 STRL SS (BLADE) ×3 IMPLANT
BRIEF STRETCH FOR OB PAD LRG (UNDERPADS AND DIAPERS) IMPLANT
COVER BACK TABLE 60X90IN (DRAPES) ×3 IMPLANT
COVER MAYO STAND STRL (DRAPES) ×3 IMPLANT
DRAPE LAPAROTOMY 100X72 PEDS (DRAPES) ×3 IMPLANT
DRAPE UTILITY XL STRL (DRAPES) ×3 IMPLANT
DRSG KUZMA FLUFF (GAUZE/BANDAGES/DRESSINGS) ×2 IMPLANT
DRSG PAD ABDOMINAL 8X10 ST (GAUZE/BANDAGES/DRESSINGS) ×3 IMPLANT
ELECT BLADE 6.5 .24CM SHAFT (ELECTRODE) ×3 IMPLANT
ELECT REM PT RETURN 9FT ADLT (ELECTROSURGICAL) ×3
ELECTRODE REM PT RTRN 9FT ADLT (ELECTROSURGICAL) ×1 IMPLANT
GAUZE SPONGE 4X4 16PLY XRAY LF (GAUZE/BANDAGES/DRESSINGS) ×3 IMPLANT
GLOVE BIO SURGEON STRL SZ 6.5 (GLOVE) ×2 IMPLANT
GLOVE BIO SURGEONS STRL SZ 6.5 (GLOVE) ×1
GLOVE INDICATOR 7.0 STRL GRN (GLOVE) ×3 IMPLANT
GOWN STRL REUS W/TWL 2XL LVL3 (GOWN DISPOSABLE) ×6 IMPLANT
KIT ROOM TURNOVER WOR (KITS) ×3 IMPLANT
MANIFOLD NEPTUNE II (INSTRUMENTS) IMPLANT
NEEDLE HYPO 22GX1.5 SAFETY (NEEDLE) ×3 IMPLANT
NS IRRIG 500ML POUR BTL (IV SOLUTION) ×3 IMPLANT
PACK BASIN DAY SURGERY FS (CUSTOM PROCEDURE TRAY) ×3 IMPLANT
PAD ABD 8X10 STRL (GAUZE/BANDAGES/DRESSINGS) IMPLANT
PAD ARMBOARD 7.5X6 YLW CONV (MISCELLANEOUS) IMPLANT
PENCIL BUTTON HOLSTER BLD 10FT (ELECTRODE) ×3 IMPLANT
SPONGE GAUZE 4X4 12PLY (GAUZE/BANDAGES/DRESSINGS) ×3 IMPLANT
SPONGE SURGIFOAM ABS GEL 100 (HEMOSTASIS) IMPLANT
SPONGE SURGIFOAM ABS GEL 12-7 (HEMOSTASIS) IMPLANT
STAPLER VISISTAT 35W (STAPLE) ×3 IMPLANT
SUT CHROMIC 2 0 SH (SUTURE) ×2 IMPLANT
SUT CHROMIC 3 0 SH 27 (SUTURE) IMPLANT
SUT PROLENE 2 0 BLUE (SUTURE) IMPLANT
SUT VIC AB 3-0 SH 27 (SUTURE) ×3
SUT VIC AB 3-0 SH 27X BRD (SUTURE) IMPLANT
SUT VIC AB 4-0 P-3 18XBRD (SUTURE) IMPLANT
SUT VIC AB 4-0 P3 18 (SUTURE)
SUT VIC AB 4-0 SH 18 (SUTURE) IMPLANT
SYR CONTROL 10ML LL (SYRINGE) ×3 IMPLANT
TRAY DSU PREP LF (CUSTOM PROCEDURE TRAY) ×3 IMPLANT
TUBE CONNECTING 12'X1/4 (SUCTIONS) ×1
TUBE CONNECTING 12X1/4 (SUCTIONS) ×2 IMPLANT
WATER STERILE IRR 500ML POUR (IV SOLUTION) IMPLANT
YANKAUER SUCT BULB TIP NO VENT (SUCTIONS) ×3 IMPLANT

## 2016-01-30 NOTE — Interval H&P Note (Signed)
History and Physical Interval Note:  01/30/2016 11:29 AM  [name]  has presented today for surgery, with the diagnosis of Internal /external bleeding hemorrhoid  The various methods of treatment have been discussed with the patient and family. After consideration of risks, benefits and other options for treatment, the patient has consented to  Procedure(s): EXAM UNDER ANESTHESIA POSSIBLE HEMORRHOIDAL PEXY SINGLE COLUMN  HEMORRHOIDECTOMY (N/A) as a surgical intervention .  The patient's history has been reviewed, patient examined, no change in status, stable for surgery.  I have reviewed the patient's chart and labs.  Questions were answered to the patient's satisfaction. The patient has been having less symptoms recently.  We will either perform a hemorrhoidectomy or hemorrhoidopexy depending EUA findings.  Pt agrees.   Rosario Adie, MD  [facility]

## 2016-01-30 NOTE — Anesthesia Preprocedure Evaluation (Addendum)
Anesthesia Evaluation  Patient identified by MRN, date of birth, ID band Patient awake    Reviewed: Allergy & Precautions, H&P , NPO status , Patient's Chart, lab work & pertinent test results  Airway Mallampati: II  TM Distance: >3 FB Neck ROM: Full    Dental  (+) Teeth Intact, Dental Advisory Given   Pulmonary neg pulmonary ROS,    Pulmonary exam normal breath sounds clear to auscultation       Cardiovascular negative cardio ROS Normal cardiovascular exam Rhythm:Regular Rate:Normal  palpitations   Neuro/Psych Glaucoma  negative neurological ROS  negative psych ROS   GI/Hepatic negative GI ROS, Neg liver ROS,   Endo/Other  Hypothyroidism goiter  Renal/GU negative Renal ROS  negative genitourinary   Musculoskeletal  (+) Arthritis , Osteoarthritis,    Abdominal (+) + obese,   Peds  Hematology negative hematology ROS (+)   Anesthesia Other Findings   Reproductive/Obstetrics                            Anesthesia Physical Anesthesia Plan  ASA: II  Anesthesia Plan: MAC   Post-op Pain Management:    Induction:   Airway Management Planned:   Additional Equipment:   Intra-op Plan:   Post-operative Plan:   Informed Consent:   Plan Discussed with: Surgeon  Anesthesia Plan Comments:       Anesthesia Quick Evaluation

## 2016-01-30 NOTE — Discharge Instructions (Addendum)
ANORECTAL SURGERY: POST OP INSTRUCTIONS °1. Take your usually prescribed home medications unless otherwise directed. °2. DIET: During the first few hours after surgery sip on some liquids until you are able to urinate.  It is normal to not urinate for several hours after this surgery.  If you feel uncomfortable, please contact the office for instructions.  After you are able to urinate,you may eat, if you feel like it.  Follow a light bland diet the first 24 hours after arrival home, such as soup, liquids, crackers, etc.  Be sure to include lots of fluids daily (6-8 glasses).  Avoid fast food or heavy meals, as your are more likely to get nauseated.  Eat a low fat diet the next few days after surgery.  Limit caffeine intake to 1-2 servings a day. °3. PAIN CONTROL: °a. Pain is best controlled by a usual combination of several different methods TOGETHER: °i. Muscle relaxation: Soak in a warm bath (or Sitz bath) three times a day and after bowel movements.  Continue to do this until all pain is resolved. °ii. Over the counter pain medication °iii. Prescription pain medication °b. Most patients will experience some swelling and discomfort in the anus/rectal area and incisions.  Heat such as warm towels, sitz baths, warm baths, etc to help relax tight/sore spots and speed recovery.  Some people prefer to use ice, especially in the first couple days after surgery, as it may decrease the pain and swelling, or alternate between ice & heat.  Experiment to what works for you.  Swelling and bruising can take several weeks to resolve.  Pain can take even longer to completely resolve. °c. It is helpful to take an over-the-counter pain medication regularly for the first few weeks.  Choose one of the following that works best for you: °i. Naproxen (Aleve, etc)  Two 220mg tabs twice a day °ii. Ibuprofen (Advil, etc) Three 200mg tabs four times a day (every meal & bedtime) °d. A  prescription for pain medication (such as percocet,  oxycodone, hydrocodone, etc) should be given to you upon discharge.  Take your pain medication as prescribed.  °i. If you are having problems/concerns with the prescription medicine (does not control pain, nausea, vomiting, rash, itching, etc), please call us [phone] to see if we need to switch you to a different pain medicine that will work better for you and/or control your side effect better. °ii. If you need a refill on your pain medication, please contact your pharmacy.  They will contact our office to request authorization. Prescriptions will not be filled after 5 pm or on week-ends. °4. KEEP YOUR BOWELS REGULAR and AVOID CONSTIPATION °a. The goal is one to two soft bowel movements a day.  You should at least have a bowel movement every other day. °b. Avoid getting constipated.  Between the surgery and the pain medications, it is common to experience some constipation. This can be very painful after rectal surgery.  Increasing fluid intake and taking a fiber supplement (such as Metamucil, Citrucel, FiberCon, etc) 1-2 times a day regularly will usually help prevent this problem from occurring.  A stool softener like colace is also recommended.  This can be purchased over the counter at your pharmacy.  You can take it up to 3 times a day.  If you do not have a bowel movement after 24 hrs since your surgery, take one does of milk of magnesia.  If you still haven't had a bowel movement 8-12 hours after   that dose, take another dose.  If you don't have a bowel movement 48 hrs after surgery, purchase a Fleets enema from the drug store and administer gently per package instructions.  If you still are having trouble with your bowel movements after that, please call the office for further instructions. °c. If you develop diarrhea or have many loose bowel movements, simplify your diet to bland foods & liquids for a few days.  Stop any stool softeners and decrease your fiber supplement.  Switching to mild  anti-diarrheal medications (Kayopectate, Pepto Bismol) can help.  If this worsens or does not improve, please call us. ° °5. Wound Care °a. Remove your bandages before your first bowel movement or 8 hours after surgery.     °b. Remove any wound packing material at this tim,e as well.  You do not need to repack the wound unless instructed otherwise.  Wear an absorbent pad or soft cotton gauze in your underwear to catch any drainage and help keep the area clean. You should change this every 2-3 hours while awake. °c. Keep the area clean and dry.  Bathe / shower every day, especially after bowel movements.  Keep the area clean by showering / bathing over the incision / wound.   It is okay to soak an open wound to help wash it.  Wet wipes or showers / gentle washing after bowel movements is often less traumatic than regular toilet paper. °d. You may have some styrofoam-like soft packing in the rectum which will come out with the first bowel movement.  °e. You will often notice bleeding with bowel movements.  This should slow down by the end of the first week of surgery °f. Expect some drainage.  This should slow down, too, by the end of the first week of surgery.  Wear an absorbent pad or soft cotton gauze in your underwear until the drainage stops. °g. Do Not sit on a rubber or pillow ring.  This can make you symptoms worse.  You may sit on a soft pillow if needed.  °6. ACTIVITIES as tolerated:   °a. You may resume regular (light) daily activities beginning the next day--such as daily self-care, walking, climbing stairs--gradually increasing activities as tolerated.  If you can walk 30 minutes without difficulty, it is safe to try more intense activity such as jogging, treadmill, bicycling, low-impact aerobics, swimming, etc. °b. Save the most intensive and strenuous activity for last such as sit-ups, heavy lifting, contact sports, etc  Refrain from any heavy lifting or straining until you are off narcotics for pain  control.   °c. You may drive when you are no longer taking prescription pain medication, you can comfortably sit for long periods of time, and you can safely maneuver your car and apply brakes. °d. You may have sexual intercourse when it is comfortable.  °7. FOLLOW UP in our office °a. Please call [facility] at [phone] to set up an appointment to see your surgeon in the office for a follow-up appointment approximately 3-4 weeks after your surgery. °b. Make sure that you call for this appointment the day you arrive home to insure a convenient appointment time. °10. IF YOU HAVE DISABILITY OR FAMILY LEAVE FORMS, BRING THEM TO THE OFFICE FOR PROCESSING.  DO NOT GIVE THEM TO YOUR DOCTOR. ° ° ° ° °WHEN TO CALL US [phone]: °1. Poor pain control °2. Reactions / problems with new medications (rash/itching, nausea, etc)  °3. Fever over 101.5 F (38.5 C) °4.   Inability to urinate 5. Nausea and/or vomiting 6. Worsening swelling or bruising 7. Continued bleeding from incision. 8. Increased pain, redness, or drainage from the incision  The clinic staff is available to answer your questions during regular business hours (8:30am-5pm).  Please dont hesitate to call and ask to speak to one of our nurses for clinical concerns.   A surgeon from [facility] is always on call at the hospitals   If you have a medical emergency, go to the nearest emergency room or call 911.    [facility], [place], [place], [place], [place]  [address] ? MAIN: [phone] ? TOLL FREE: [phone] ? FAX [phone] [email or web address]    Post Anesthesia Home Care Instructions  Activity: Get plenty of rest for the remainder of the day. A responsible adult should stay with you for 24 hours following the procedure.  For the next 24 hours, DO NOT: -Drive a car -Paediatric nurse -Drink alcoholic beverages -Take any medication unless instructed by your physician -Make  any legal decisions or sign important papers.  Meals: Start with liquid foods such as gelatin or soup. Progress to regular foods as tolerated. Avoid greasy, spicy, heavy foods. If nausea and/or vomiting occur, drink only clear liquids until the nausea and/or vomiting subsides. Call your physician if vomiting continues.  Special Instructions/Symptoms: Your throat may feel dry or sore from the anesthesia or the breathing tube placed in your throat during surgery. If this causes discomfort, gargle with warm salt water. The discomfort should disappear within 24 hours.  If you had a scopolamine patch placed behind your ear for the management of post- operative nausea and/or vomiting:  1. The medication in the patch is effective for 72 hours, after which it should be removed.  Wrap patch in a tissue and discard in the trash. Wash hands thoroughly with soap and water. 2. You may remove the patch earlier than 72 hours if you experience unpleasant side effects which may include dry mouth, dizziness or visual disturbances. 3. Avoid touching the patch. Wash your hands with soap and water after contact with the patch.    Bupivacaine Liposomal Suspension for Injection What is this medicine? BUPIVACAINE LIPOSOMAL (bue PIV a kane LIP oh som al) is an anesthetic. It causes loss of feeling in the skin or other tissues. It is used to prevent and to treat pain from some procedures. This medicine may be used for other purposes; ask your health care provider or pharmacist if you have questions. What should I tell my health care provider before I take this medicine? They need to know if you have any of these conditions: -heart disease -kidney disease -liver disease -an unusual or allergic reaction to bupivacaine, other medicines, foods, dyes, or preservatives -pregnant or trying to get pregnant -breast-feeding How should I use this medicine? This medicine is for injection into the affected area. It is given  by a health care professional in a hospital or clinic setting. Talk to your pediatrician regarding the use of this medicine in children. Special care may be needed. Overdosage: If you think you have taken too much of this medicine contact a poison control center or emergency room at once. NOTE: This medicine is only for you. Do not share this medicine with others. What if I miss a dose? This does not apply. What may interact with this medicine? -other anesthetics This list may not describe all possible interactions. Give your health care provider a list  of all the medicines, herbs, non-prescription drugs, or dietary supplements you use. Also tell them if you smoke, drink alcohol, or use illegal drugs. Some items may interact with your medicine. What should I watch for while using this medicine? Your condition will be monitored carefully while you are receiving this medicine. Be careful to avoid injury while the area is numb and you are not aware of pain. Do not use another medicine that contains bupivacaine for 96 hours after receiving bupivacaine liposomal. You may have more side effects. Talk to your doctor if you have questions. What side effects may I notice from receiving this medicine? Side effects that you should report to your doctor or health care professional as soon as possible: -allergic reactions like skin rash, itching or hives, swelling of the face, lips, or tongue -breathing problems -changes in vision -dizziness -fast or slow, irregular heartbeat -joint pain, stiffness, or loss of motion -seizures Side effects that usually do not require medical attention (Report these to your doctor or health care professional if they continue or are bothersome.): -constipation -irritation at site where injected -nausea, vomiting -tiredness This list may not describe all possible side effects. Call your doctor for medical advice about side effects. You may report side effects to FDA at  1-800-FDA-1088. Where should I keep my medicine? This drug is given in a hospital or clinic and will not be stored at home. NOTE: This sheet is a summary. It may not cover all possible information. If you have questions about this medicine, talk to your doctor, pharmacist, or health care provider.    2016, [employer]. (2013-03-06 12:35:57)

## 2016-01-30 NOTE — H&P (View-Only) (Signed)
History of Present Illness Pamela Ruff MD; 0000000 10:52 AM) The patient is a 75 year old female who presents with hemorrhoids. We are asked to see the patient in consultation by Dr. Serita Weaver to evaluate her for hemorrhoids. The patient is a 75 year old white female who is a previous breast cancer patient of Dr [name]. Over the last 2-3 months she has been experiencing bleeding with bowel movements. She denies any rectal pain. She has been taking MiraLAX and her bowels have been moving more regularly. She underwent 2 episodes of rubber band ligation approximately one month apart. This seemed to resolve her symptoms at first. Over the last couple weeks she has developed new rectal bleeding and is here for follow-up.   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 Tonsillectomy Knee Surgery Right. Appendectomy  Diagnostic Studies History Pamela Ruff, MD; 0000000 10:53 AM) Mammogram within last year Colonoscopy 1-5 years ago  Allergies [name], [name]; 01/19/2016 10:37 AM) No Known Drug Allergies 02/28/2015  Medication History [name], [name]; 01/19/2016 10:37 AM) [name] (Oral) Active. MiraLax (Oral) Active. Anastrozole (1MG  Tablet, Oral) Active. Dorzolamide HCl-Timolol Mal (22.3-6.8MG /ML Solution, Ophthalmic) Active. Vitamin D (Cholecalciferol) (400UNIT Tablet Chewable, Oral) Active. Aspirin EC (81MG  Tablet DR, Oral) Active. Lumigan (0.01% Solution, Ophthalmic) Active. Zetia (10MG  Tablet, Oral) Active. Levothyroxine Sodium (112MCG Tablet, Oral) Active. Fish Oil Active. Vitamin E  Complex (1000UNIT Capsule, Oral) Active. Meloxicam (15MG  Tablet, Oral) Active. Medications Reconciled  Social History Pamela Ruff, MD; 0000000 10:54 AM) Tobacco use Never smoker. No alcohol use  Pregnancy / Birth History Pamela Ruff, MD; 0000000 10:54 AM) Age at menarche 68 years. Age of menopause <45 Maternal age 74-35 Para 46 Gravida 1    8 [name] [name]; 01/19/2016 10:37 AM) 01/19/2016 10:37 AM Weight: 196.8 lb Height: 67in Body Surface Area: 2.01 m Body Mass Index: 30.82 kg/m  Temp.: 97.110F  Pulse: 80 (Regular)  BP: 126/74 (Sitting, Left Arm, Standard)      Physical Exam Pamela Ruff MD; 0000000 10:53 AM)  General Mental Status-Alert. General Appearance-Consistent with stated age. Hydration-Well hydrated. Voice-Normal.  Head and Neck Head-normocephalic, atraumatic with no lesions or palpable masses. Trachea-midline.  Eye Eyeball - Bilateral-Extraocular movements intact. Sclera/Conjunctiva - Bilateral-No scleral icterus.  Chest and Lung Exam Chest and lung exam reveals -quiet, even and easy respiratory effort with no use of accessory muscles and on auscultation, normal breath sounds, no adventitious sounds and normal vocal resonance. Inspection Chest Wall - Normal. Back - normal.  Cardiovascular Cardiovascular examination reveals -normal heart sounds, regular rate and rhythm with no murmurs.  Abdomen Inspection Inspection of the abdomen reveals - No Hernias. Skin - Scar - no surgical scars. Palpation/Percussion Palpation and Percussion of the abdomen reveal - Soft, Non Tender, No Rebound tenderness, No Rigidity (guarding) and No hepatosplenomegaly. Auscultation Auscultation of the abdomen reveals - Bowel sounds normal.  Rectal Note: The patient has a left lateral internal and external hemorrhoid that appears to be bleeding. Minimal other external disease noted.  Neurologic Neurologic  evaluation reveals -alert and oriented x 3 with no impairment of recent or remote memory. Mental Status-Normal.  Musculoskeletal Normal Exam - Left-Upper Extremity Strength Normal and Lower Extremity Strength Normal. Normal Exam - Right-Upper Extremity Strength  Normal and Lower Extremity Strength Normal.  Lymphatic Head & Neck  General Head & Neck Lymphatics: Bilateral - Description - Normal. Axillary  General Axillary Region: Bilateral - Description - Normal. Tenderness - Non Tender. Femoral & Inguinal  Generalized Femoral & Inguinal Lymphatics: Bilateral - Description - Normal. Tenderness - Non Tender.    Assessment & Plan Pamela Ruff MD; 0000000 10:51 AM)  INTERNAL HEMORRHOID, BLEEDING (K64.8) Impression: 75 year old female who is status post several episodes of rubber band ligation and presents with a new area of rectal bleeding approximately 2 months after her last rubber band ligation. On exam today she has an external and internal component that appear to be bleeding. I do not think the rubber band ligation would resolve the issue. I have recommended hemorrhoidectomy. We will plan on doing a hemorrhoidal pexy of any other areas that could be causing bleeding as well.

## 2016-01-30 NOTE — Transfer of Care (Signed)
Immediate Anesthesia Transfer of Care Note  Patient: [name]  Procedure(s) Performed: Procedure(s) (LRB): EXAM UNDER ANESTHESIA POSSIBLE HEMORRHOIDAL PEXY SINGLE COLUMN  HEMORRHOIDECTOMY (N/A)  Patient Location: PACU  Anesthesia Type: MAC  Level of Consciousness: awake, alert , oriented and patient cooperative  Airway & Oxygen Therapy: Patient Spontanous Breathing and Patient connected to face mask oxygen  Post-op Assessment: Report given to PACU RN and Post -op Vital signs reviewed and stable  Post vital signs: Reviewed and stable  Complications: No apparent anesthesia complications  Last Vitals:  Filed Vitals:   01/30/16 1044 01/30/16 1243  BP: 148/69 150/81  Pulse: 65 74  Temp: 36.3 C 36.6 C  Resp: 16 14

## 2016-01-30 NOTE — Telephone Encounter (Signed)
spoke w/ pt confirmed appt for 10/12 @3 :15

## 2016-01-30 NOTE — Op Note (Signed)
01/30/2016  12:37 PM  PATIENT:  [name]  75 y.o. female  Patient Care Team: Mayra Neer, MD as PCP - General (Family Medicine) Nicholas Lose, MD as Consulting Physician (Hematology and Oncology) Autumn Messing III, MD as Consulting Physician (General Surgery) Arloa Koh, MD as Consulting Physician (Radiation Oncology) Sylvan Cheese, NP as Nurse Practitioner (Hematology and Oncology)  PRE-OPERATIVE DIAGNOSIS:  Internal /external bleeding hemorrhoid  POST-OPERATIVE DIAGNOSIS:  Internal bleeding hemorrhoid  PROCEDURE:   SINGLE COLUMN  HEMORRHOIDECTOMY, EXAM UNDER ANESTHESIA,  HEMORRHOIDAL PEXY   Surgeon(s): [name], MD  ASSISTANT: none   ANESTHESIA:   local and MAC  SPECIMEN:  Source of Specimen:  L lateral internal hemorrhoid  DISPOSITION OF SPECIMEN:  PATHOLOGY  COUNTS:  YES  PLAN OF CARE: Discharge to home after PACU  PATIENT DISPOSITION:  PACU - hemodynamically stable.  INDICATION: 75 y.o. F with recurrent rectal bleeding despite banding in the office.     OR FINDINGS: Grade 3 L lateral hemorrhoid  DESCRIPTION: the patient was identified in the preoperative holding area and taken to the OR where they were laid on the operating room table.  MAC anesthesia was induced without difficulty. The patient was then positioned in prone jackknife position with buttocks gently taped apart.  The patient was then prepped and draped in usual sterile fashion.  SCDs were noted to be in place prior to the initiation of anesthesia. A surgical timeout was performed indicating the correct patient, procedure, positioning and need for preoperative antibiotics.  A rectal block was performed using Marcaine with epinephrine.    I began with a digital rectal exam.  There were no abnormal masses. There was a grade 3 left lateral hemorrhoid with a separate posterior component.  I then placed a Hill-Ferguson anoscope into the anal canal and evaluated this completely.  The left  lateral hemorrhoid appeared to be the source of her prolapse. There were no major hemorrhoids on the right side. Sphincter tone was moderate. The posterior component of the hemorrhoid was the largest part and I resected this using Metzenbaum scissors. I closed the mucosal defect using a 3-0 Vicryl stitch proximally and a 3-0 chromic suture distally. There was still a smaller lateral component to the hemorrhoid that appeared to still be prolapsing. I decided to perform hemorrhoidal pexy using the 3-0 Vicryl suture. This pulled the hemorrhoid neatly inside and eliminated any further prolapse. Once this was completed the area was infused with Exparel. A dressing was placed. The patient was awakened from anesthesia and sent to the postanesthesia care unit in stable condition. All counts were correct per operating room staff.

## 2016-02-02 ENCOUNTER — Encounter (HOSPITAL_BASED_OUTPATIENT_CLINIC_OR_DEPARTMENT_OTHER): Payer: Self-pay | Admitting: General Surgery

## 2016-02-06 ENCOUNTER — Other Ambulatory Visit: Payer: Self-pay | Admitting: Hematology and Oncology

## 2016-02-08 NOTE — Anesthesia Postprocedure Evaluation (Signed)
Anesthesia Post Note  Patient: [name]  Procedure(s) Performed: Procedure(s) (LRB): EXAM UNDER ANESTHESIA POSSIBLE HEMORRHOIDAL PEXY SINGLE COLUMN  HEMORRHOIDECTOMY (N/A)  Patient location during evaluation: PACU Anesthesia Type: General Level of consciousness: awake and alert Pain management: pain level controlled Vital Signs Assessment: post-procedure vital signs reviewed and stable Respiratory status: spontaneous breathing, nonlabored ventilation, respiratory function stable and patient connected to nasal cannula oxygen Cardiovascular status: blood pressure returned to baseline and stable Postop Assessment: no signs of nausea or vomiting Anesthetic complications: no    Last Vitals:  Filed Vitals:   01/30/16 1300 01/30/16 1315  BP: 149/75 146/73  Pulse: 68 67  Temp:    Resp: 15 14    Last Pain:  Filed Vitals:   02/02/16 1007  PainSc: 2                  [name] L

## 2016-02-11 ENCOUNTER — Ambulatory Visit
Admission: RE | Admit: 2016-02-11 | Discharge: 2016-02-11 | Disposition: A | Discharge status: Home / Self Care | Payer: Medicare Other | Source: Ambulatory Visit | Attending: Hematology and Oncology | Admitting: Hematology and Oncology

## 2016-02-11 DIAGNOSIS — C50211 Malignant neoplasm of upper-inner quadrant of right female breast: Secondary | ICD-10-CM

## 2016-06-12 ENCOUNTER — Other Ambulatory Visit: Payer: Self-pay | Admitting: Hematology and Oncology

## 2016-06-12 DIAGNOSIS — C50211 Malignant neoplasm of upper-inner quadrant of right female breast: Secondary | ICD-10-CM

## 2016-07-27 IMAGING — MR MR BREAST BILAT WO/W CM
8 of 13 series · 31 of 48 positions shown · IV contrast (multihance)
Comparison: Recent imaging and biopsy examinations.

CLINICAL DATA: Recently diagnosed right breast invasive ductal
carcinoma and ductal carcinoma in situ.

LABS:  BUN and creatinine were obtained on site at [HOSPITAL]
[HOSPITAL].
Results:  BUN 18 mg/dL,  Creatinine 0.6 mg/dL.
EXAM:
BILATERAL BREAST MRI WITH AND WITHOUT CONTRAST
TECHNIQUE: Multiplanar, multisequence MR images of both breasts were obtained
prior to and following the intravenous administration of 18 ml of
MultiHance.

[Series 2: T2 · axial · 3.0mm · 0.94mm/px · z∈[-90,+78]mm · 3 of 57 slices shown]
[im 1/57]
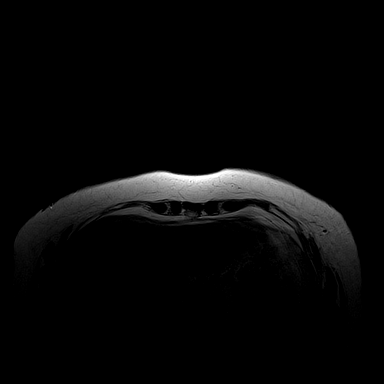
[im 29/57]
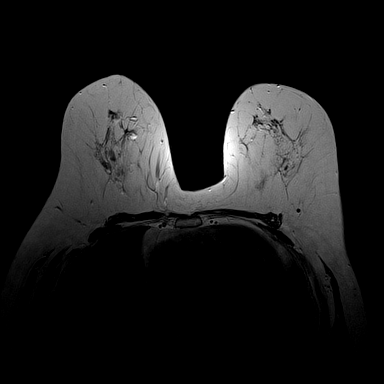
[im 57/57]
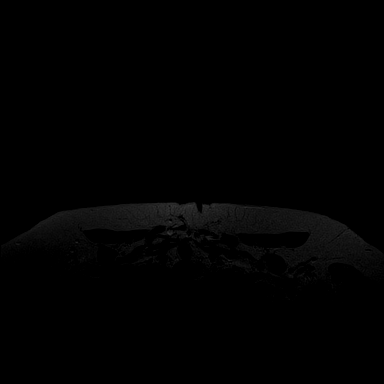

[Series 3: t2_tirm_tra ipat (a-p) · axial · 3.0mm · 0.70mm/px · z∈[-90,+78]mm · 2 of 57 slices shown]
[im 1/57]
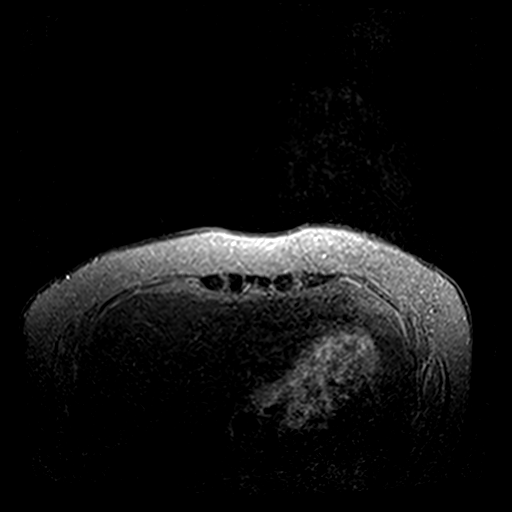
[im 57/57]
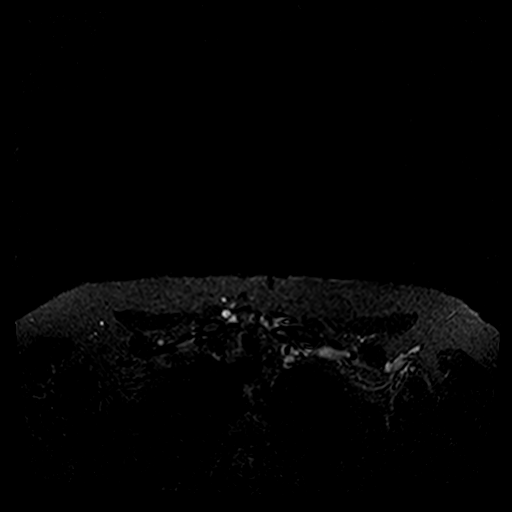

[Series 4: fl3d pre-cm no · axial · non-contrast · 1.2mm · 0.94mm/px · z∈[-101,+90]mm · 5 of 160 slices shown]
[im 1/160]
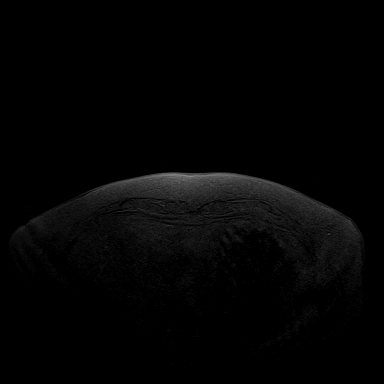
[im 40/160]
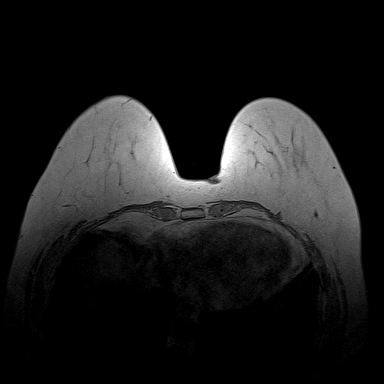
[im 80/160]
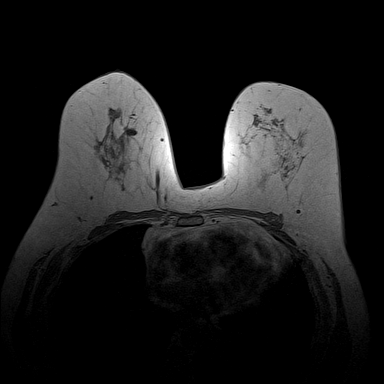
[im 120/160]
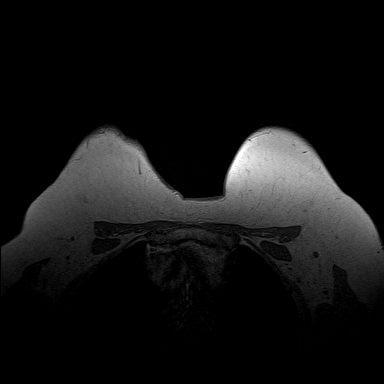
[im 160/160]
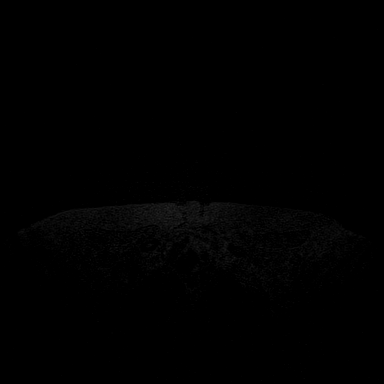

[Series 5: fl3d pre-cm · axial · non-contrast · 1.2mm · 0.94mm/px · z∈[-101,+90]mm · 5 of 160 slices shown]
[im 1/160]
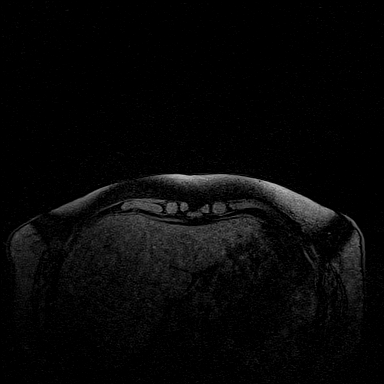
[im 40/160]
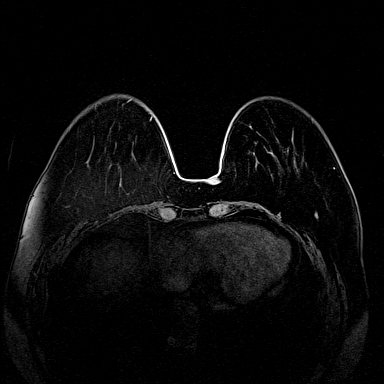
[im 80/160]
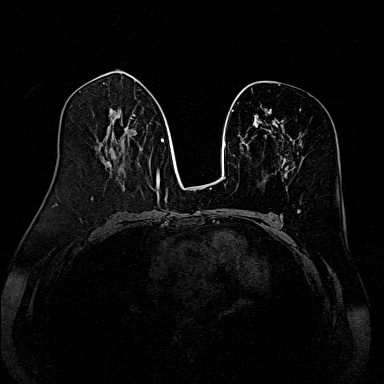
[im 120/160]
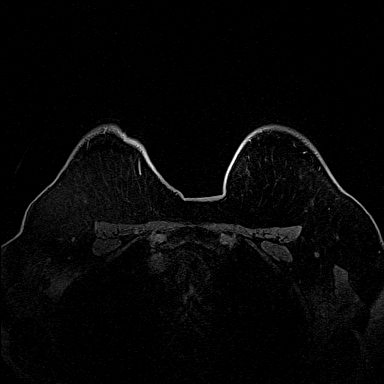
[im 160/160]
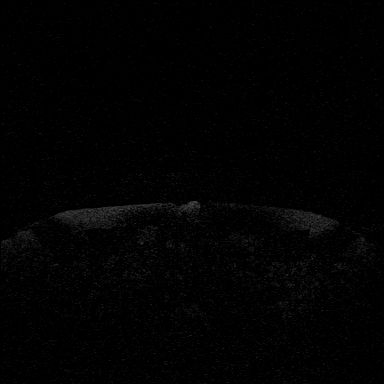

[Series 6: fl3d post-cm 20 · axial · 1.2mm · 0.94mm/px · z∈[-101,+90]mm · 5 of 160 slices shown (1 of 3)]
[im 1/160]
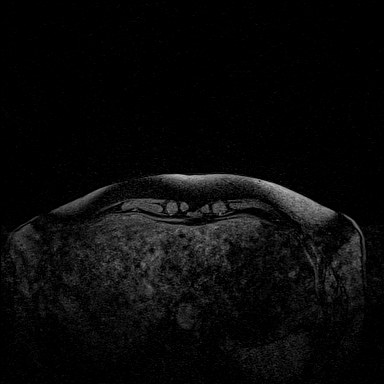
[im 40/160]
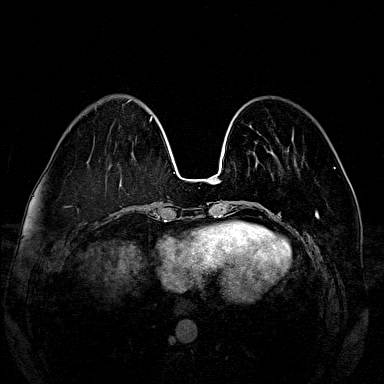
[im 80/160]
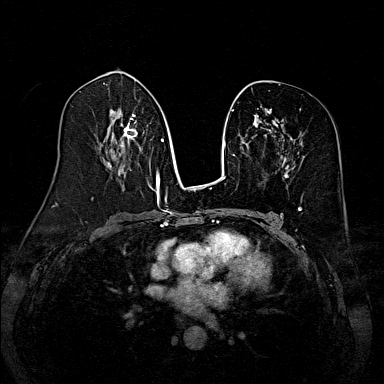
[im 120/160]
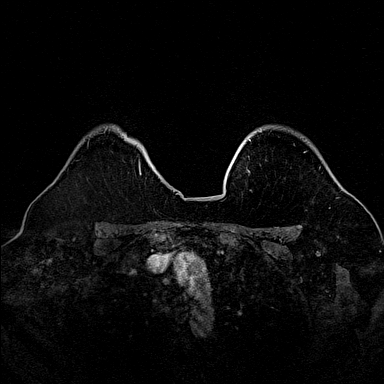
[im 160/160]
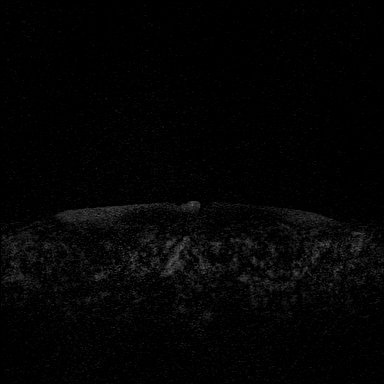

[Series 7: fl3d post-cm 20 · axial · 1.2mm · 0.94mm/px · z∈[-101,+90]mm · 5 of 160 slices shown (2 of 3)]
[im 1/160]
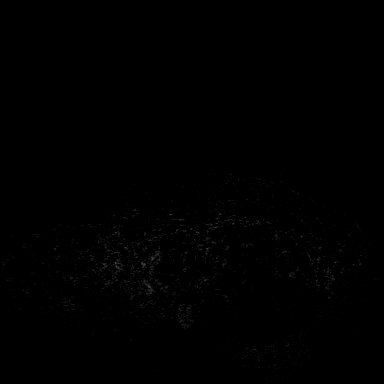
[im 40/160]
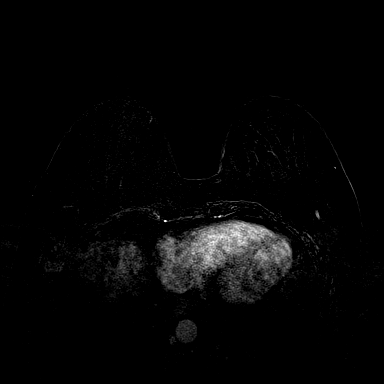
[im 80/160]
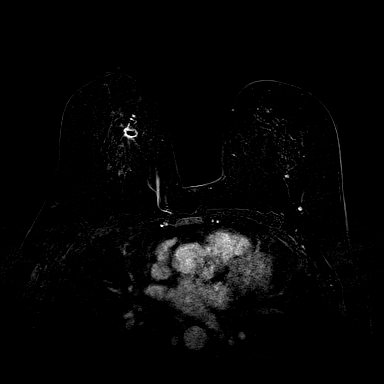
[im 120/160]
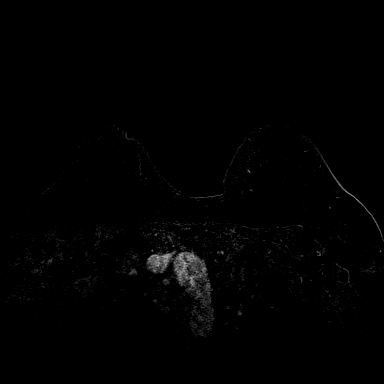
[im 160/160]
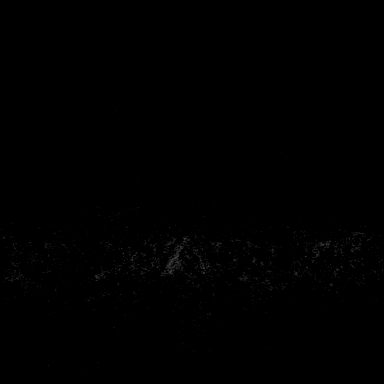

[Series 8: fl3d post-cm 20 · axial · 192.0mm · 0.94mm/px · 1 of 1 slices shown (3 of 3)]
[im 1/1]
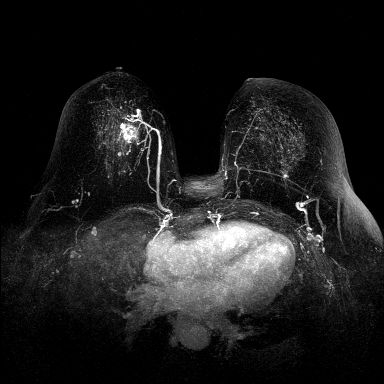

[Series 9: fl3d post-cm 3min · axial · 1.2mm · 0.94mm/px · z∈[-101,+90]mm · 5 of 160 slices shown]
[im 1/160]
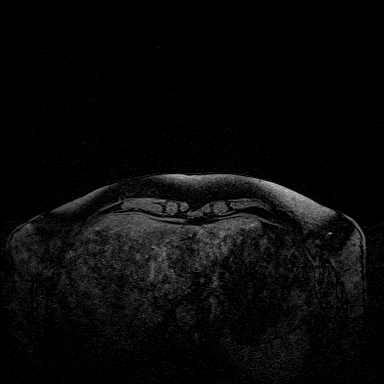
[im 40/160]
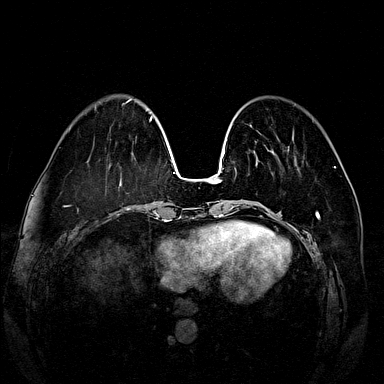
[im 80/160]
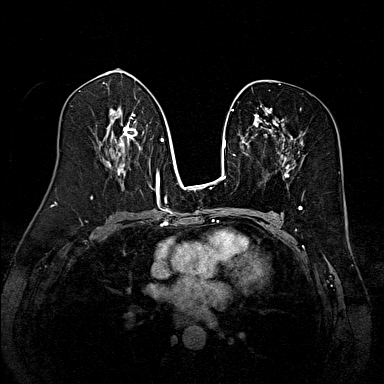
[im 120/160]
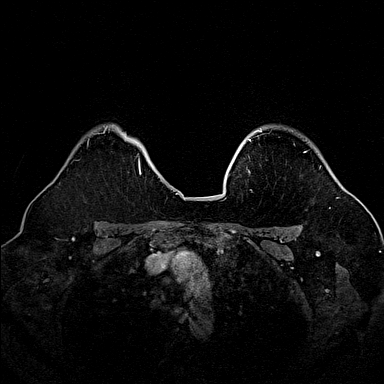
[im 160/160]
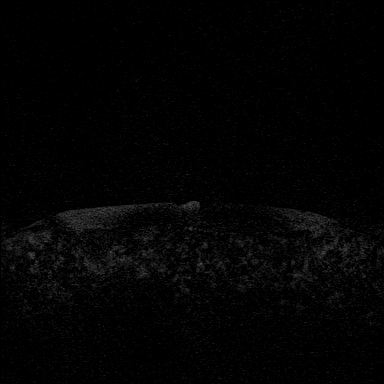

[31 of 48 positions shown; findings below may reference images not displayed]

THREE-DIMENSIONAL MR IMAGE RENDERING ON INDEPENDENT WORKSTATION:

Three-dimensional MR images were rendered by post-processing of the
original MR data on an independent workstation. The
three-dimensional MR images were interpreted, and findings are
reported in the following complete MRI report for this study. Three
dimensional images were evaluated at the independent [employer]
workstation
FINDINGS: Breast composition: b. Scattered fibroglandular tissue.

Background parenchymal enhancement: Mild

Right breast: Vertically oriented biopsy cavity in the upper inner
quadrant of the right breast with a normal surrounding thin rim of
enhancement. There is also a bilobed area of nodularity along the
anterior, medial aspect of the midportion of the biopsy cavity. This
area of nodularity measures 1.3 x 0.6 x 0.6 cm maximum dimensions
and demonstrates a mixture of enhancement kinetics, including rapid
wash-in/washout. This is in the region of the central portion of
distortion seen mammographically, also seen on today's T2 weighted
image number 29. This is in the region of the patient's biopsy
marker clip.

There is minimal post biopsy enhancement in the adjacent soft
tissues. No additional masses or areas of enhancement suspicious for
malignancy.

Left breast: No mass or abnormal enhancement.

Lymph nodes: No abnormal appearing lymph nodes.

Ancillary findings:  None.
IMPRESSION: 1.3 x 0.6 x 0.6 cm biopsy-proven ductal carcinoma and ductal
carcinoma in situ in the upper inner quadrant of the right breast.
Otherwise, unremarkable examination.

RECOMMENDATION:
Treatment plan.

BI-RADS CATEGORY  6: Known biopsy-proven malignancy.

## 2016-08-05 ENCOUNTER — Ambulatory Visit (HOSPITAL_BASED_OUTPATIENT_CLINIC_OR_DEPARTMENT_OTHER): Payer: Medicare Other | Admitting: Hematology and Oncology

## 2016-08-05 ENCOUNTER — Encounter: Payer: Self-pay | Admitting: Hematology and Oncology

## 2016-08-05 DIAGNOSIS — M6530 Trigger finger, unspecified finger: Secondary | ICD-10-CM | POA: Diagnosis not present

## 2016-08-05 DIAGNOSIS — C773 Secondary and unspecified malignant neoplasm of axilla and upper limb lymph nodes: Secondary | ICD-10-CM

## 2016-08-05 DIAGNOSIS — Z170 Estrogen receptor positive status [ER+]: Secondary | ICD-10-CM

## 2016-08-05 DIAGNOSIS — R682 Dry mouth, unspecified: Secondary | ICD-10-CM

## 2016-08-05 DIAGNOSIS — C50211 Malignant neoplasm of upper-inner quadrant of right female breast: Secondary | ICD-10-CM

## 2016-08-05 MED ORDER — LETROZOLE 2.5 MG PO TABS: 2.5000 mg | ORAL_TABLET | Freq: Every day | ORAL | 3 refills | Status: DC

## 2016-08-05 NOTE — Assessment & Plan Note (Signed)
Right lumpectomy 03/19/2015: IDC 1.7 cm with DCIS, micromet in one lymph node measuring 2 mm negative for ECS, other lymph node negative, T1cN1a stage II a, ER 99%, PR 98%, HER-2 negative, Ki-67 12%, Mammaprint low-risk luminal A, status post adjuvant radiation, started anastrozole 1 mg daily 06/18/2015  Anastrozole toxicities: 1. Heart flashes resolved 2. Dry mouth 3. Occasional muscle pains also resolved  Breast cancer surveillance: 1. Breast exam 08/05/2016: No palpable lumps or nodules 2. Annual mammograms 02/11/2016: Benign  Return to clinic in 6 months for follow-up

## 2016-08-05 NOTE — Progress Notes (Signed)
Patient Care Team: Mayra Neer, MD as PCP - General (Family Medicine) Nicholas Lose, MD as Consulting Physician (Hematology and Oncology) Autumn Messing III, MD as Consulting Physician (General Surgery) Arloa Koh, MD as Consulting Physician (Radiation Oncology) Sylvan Cheese, NP as Nurse Practitioner (Hematology and Oncology)  DIAGNOSIS: Breast cancer of upper-inner quadrant of right female breast [facility])   Staging form: Breast, AJCC 7th Edition   - Clinical stage from 02/25/2015: Stage 0 (Tis (DCIS), N0, M0) - Unsigned   - Pathologic stage from 03/19/2015: Stage IA (T1c, N0(i+), cM0) - Signed by Nicholas Lose, MD on 01/29/2016  SUMMARY OF ONCOLOGIC HISTORY:   Breast cancer of upper-inner quadrant of right female breast ([facility])   02/03/2015 Mammogram    Right breast: possible abnormality requiring further imaging      02/06/2015 Breast US    Right breast: no correlate seen      02/20/2015 Initial Biopsy    Right breast core needle bx: Invasive ductal carcinoma, grade 1, with DCIS; ER+ (99%), PR+ (98%), HER2/neu negative (ratio 1.71), Ki67 12%.      02/25/2015 Breast MRI    Right breast: 1.3 x 0.6 x 0.6 cm bilobed area of nodularity in the right breast, post biopsy change      02/25/2015 Clinical Stage    Stage IA: T1c N0      03/19/2015 Definitive Surgery    Right lumpectomy / [facility] Marlou Starks): IDC 1.7 cm with DCIS, micromet in one lymph node measuring 2 mm negative for ECS, other lymph node negative;  ER+ (99%), PR+ (98%), HER-2 negative, Ki-67 12%      03/19/2015 Pathologic Stage    Stage IIA: pT1c pN1a       03/19/2015 Procedure    Mammaprint low risk luminal A      05/05/2015 - 06/05/2015 Radiation Therapy    Adjuvant RT Valere Dross): Right breast 48 Gy over 24 fractions       06/19/2015 -  Anti-estrogen oral therapy    Anastrozole 1 mg daily. Switched to letrozole 08/05/2016 due to trigger fingers      09/11/2015 Survivorship    Survivorship visit completed and copy of  care plan provided to patient       CHIEF COMPLIANT: follow-up on anastrozole therapy  INTERVAL HISTORY: [name] is a 75 year old with above-mentioned history of right breast cancer treated with lumpectomy radiation and is currently on antiestrogen therapy. She has been on anastrozole since August 2016. Initially she tolerated it fairly well. But lately she has been having problems with her fingers. She has trigger finger and has seen orthopedic specialists who are planning to do steroid injections as well as maybe even consider doing surgery. Patient is very anxious about doing surgery in her hands. She would rather take the injection and discuss if anastrozole can be switched to any other medication.  REVIEW OF SYSTEMS:   Constitutional: Denies fevers, chills or abnormal weight loss Eyes: Denies blurriness of vision Ears, nose, mouth, throat, and face: Denies mucositis or sore throat Respiratory: Denies cough, dyspnea or wheezes Cardiovascular: Denies palpitation, chest discomfort Gastrointestinal:  Denies nausea, heartburn or change in bowel habits Skin: Denies abnormal skin rashes Lymphatics: Denies new lymphadenopathy or easy bruising Neurological:Denies numbness, tingling or new weaknesses Behavioral/Psych: Mood is stable, no new changes  Extremities: No lower extremity edema Breast:  denies any pain or lumps or nodules in either breasts All other systems were reviewed with the patient and are negative.  I have reviewed the  past medical history, past surgical history, social history and family history with the patient and they are unchanged from previous note.  ALLERGIES:  is allergic to statins.  MEDICATIONS:  Current Outpatient Prescriptions  Medication Sig Dispense Refill  . aspirin EC 81 MG tablet Take 1 tablet (81 mg total) by mouth daily. 30 tablet 1  . bimatoprost (LUMIGAN) 0.01 % SOLN Place 1 drop into both eyes at bedtime.     . Calcium-Vitamin D (CALTRATE  600 PLUS-VIT D PO) Take 2 tablets by mouth every evening.     . dorzolamide-timolol (COSOPT) 22.3-6.8 MG/ML ophthalmic solution Place 1 drop into both eyes 2 (two) times daily.     Marland Kitchen ezetimibe (ZETIA) 10 MG tablet Take 10 mg by mouth every evening.     Marland Kitchen letrozole (FEMARA) 2.5 MG tablet Take 1 tablet (2.5 mg total) by mouth daily. 90 tablet 3  . levothyroxine (SYNTHROID, LEVOTHROID) 112 MCG tablet Take 112 mcg by mouth daily before breakfast.   11  . multivitamin (THERAGRAN) per tablet Take 1 tablet by mouth daily.     . Omega-3 Fatty Acids (FISH OIL) 1000 MG CAPS Take 2,000 capsules by mouth 2 (two) times daily.    . polyethylene glycol (MIRALAX / GLYCOLAX) packet Take 17 g by mouth daily.    . vitamin E 400 UNIT capsule Take 400 Units by mouth every morning.     No current facility-administered medications for this visit.     PHYSICAL EXAMINATION: ECOG PERFORMANCE STATUS: 1 - Symptomatic but completely ambulatory  Vitals:   08/05/16 1504  BP: 130/68  Pulse: 75  Resp: 18  Temp: 97.7 F (36.5 C)   Filed Weights   08/05/16 1504  Weight: 196 lb 12.8 oz (89.3 kg)    GENERAL:alert, no distress and comfortable SKIN: skin color, texture, turgor are normal, no rashes or significant lesions EYES: normal, Conjunctiva are pink and non-injected, sclera clear OROPHARYNX:no exudate, no erythema and lips, buccal mucosa, and tongue normal  NECK: supple, thyroid normal size, non-tender, without nodularity LYMPH:  no palpable lymphadenopathy in the cervical, axillary or inguinal LUNGS: clear to auscultation and percussion with normal breathing effort HEART: regular rate & rhythm and no murmurs and no lower extremity edema ABDOMEN:abdomen soft, non-tender and normal bowel sounds MUSCULOSKELETAL:no cyanosis of digits and no clubbing  NEURO: alert & oriented x 3 with fluent speech, no focal motor/sensory deficits EXTREMITIES: No lower extremity edema  LABORATORY DATA:  I have reviewed the data  as listed   Chemistry      Component Value Date/Time   NA 139 05/24/2013 0940   K 3.6 05/24/2013 0940   CL 105 05/24/2013 0940   CO2 27 05/24/2013 0940   BUN 22 05/24/2013 0940   CREATININE 0.83 05/24/2013 0940      Component Value Date/Time   CALCIUM 8.8 05/24/2013 0940   ALKPHOS 44 05/16/2013 1013   AST 22 05/16/2013 1013   ALT 27 05/16/2013 1013   BILITOT 0.4 05/16/2013 1013       Lab Results  Component Value Date   WBC 15.4 (H) 06/06/2013   HGB 14.0 01/30/2016   HCT 32.1 (L) 06/06/2013   MCV 94.1 06/06/2013   PLT 538 (H) 06/06/2013   NEUTROABS 13.2 (H) 06/06/2013     ASSESSMENT & PLAN:  Breast cancer of upper-inner quadrant of right female breast Right lumpectomy 03/19/2015: IDC 1.7 cm with DCIS, micromet in one lymph node measuring 2 mm negative for ECS, other lymph node negative,  T1cN1a stage II a, ER 99%, PR 98%, HER-2 negative, Ki-67 12%, Mammaprint low-risk luminal A, status post adjuvant radiation, started anastrozole 1 mg daily 06/18/2015  Anastrozole toxicities: 1. Hot flashes resolved 2. Dry mouth 3. Trigger fingers which are painful and making it difficult for her to do crockett. I will change her treatment to letrozole to see if it makes of any improvement on her trigger finger.  Breast cancer surveillance: 1. Breast exam 08/05/2016: No palpable lumps or nodules 2. Annual mammograms 02/11/2016: Benign  Return to clinic in 3 months for follow-up   No orders of the defined types were placed in this encounter.  The patient has a good understanding of the overall plan. she agrees with it. she will call with any problems that may develop before the next visit here.   Rulon Eisenmenger, MD 08/05/16

## 2016-08-17 ENCOUNTER — Ambulatory Visit (INDEPENDENT_AMBULATORY_CARE_PROVIDER_SITE_OTHER): Payer: Self-pay | Admitting: Orthopaedic Surgery

## 2016-11-03 NOTE — Assessment & Plan Note (Signed)
Right lumpectomy 03/19/2015: IDC 1.7 cm with DCIS, micromet in one lymph node measuring 2 mm negative for ECS, other lymph node negative, T1cN1a stage II a, ER 99%, PR 98%, HER-2 negative, Ki-67 12%, Mammaprint low-risk luminal A, status post adjuvant radiation, started anastrozole 1 mg daily 06/18/2015 changed to Letrozole 08/05/16  Letrozole toxicities: 3. Trigger fingers which are painful and making it difficult for her to do crochet.  Breast cancer surveillance: 1. Breast exam 11/04/16: No palpable lumps or nodules 2. Annual mammograms 02/11/2016: Benign  Return to clinic in 6 months for follow-up

## 2016-11-04 ENCOUNTER — Ambulatory Visit (HOSPITAL_BASED_OUTPATIENT_CLINIC_OR_DEPARTMENT_OTHER): Payer: Medicare Other | Admitting: Hematology and Oncology

## 2016-11-04 DIAGNOSIS — Z79811 Long term (current) use of aromatase inhibitors: Secondary | ICD-10-CM

## 2016-11-04 DIAGNOSIS — C50211 Malignant neoplasm of upper-inner quadrant of right female breast: Secondary | ICD-10-CM

## 2016-11-04 DIAGNOSIS — Z170 Estrogen receptor positive status [ER+]: Secondary | ICD-10-CM

## 2016-11-04 NOTE — Progress Notes (Signed)
Patient Care Team: Mayra Neer, MD as PCP - General (Family Medicine) Nicholas Lose, MD as Consulting Physician (Hematology and Oncology) Autumn Messing III, MD as Consulting Physician (General Surgery) Arloa Koh, MD as Consulting Physician (Radiation Oncology) Sylvan Cheese, NP as Nurse Practitioner (Hematology and Oncology)  DIAGNOSIS:  Encounter Diagnosis  Name Primary?  . Malignant neoplasm of upper-inner quadrant of right breast in female, estrogen receptor positive ([place])     SUMMARY OF ONCOLOGIC HISTORY:   Breast cancer of upper-inner quadrant of right female breast ([place])   02/03/2015 Mammogram    Right breast: possible abnormality requiring further imaging      02/06/2015 Breast US    Right breast: no correlate seen      02/20/2015 Initial Biopsy    Right breast core needle bx: Invasive ductal carcinoma, grade 1, with DCIS; ER+ (99%), PR+ (98%), HER2/neu negative (ratio 1.71), Ki67 12%.      02/25/2015 Breast MRI    Right breast: 1.3 x 0.6 x 0.6 cm bilobed area of nodularity in the right breast, post biopsy change      02/25/2015 Clinical Stage    Stage IA: T1c N0      03/19/2015 Definitive Surgery    Right lumpectomy / [facility] Marlou Starks): IDC 1.7 cm with DCIS, micromet in one lymph node measuring 2 mm negative for ECS, other lymph node negative;  ER+ (99%), PR+ (98%), HER-2 negative, Ki-67 12%      03/19/2015 Pathologic Stage    Stage IIA: pT1c pN1a       03/19/2015 Procedure    Mammaprint low risk luminal A      05/05/2015 - 06/05/2015 Radiation Therapy    Adjuvant RT Valere Dross): Right breast 48 Gy over 24 fractions       06/19/2015 -  Anti-estrogen oral therapy    Anastrozole 1 mg daily. Switched to letrozole 08/05/2016 due to trigger fingers      09/11/2015 Survivorship    Survivorship visit completed and copy of care plan provided to patient       CHIEF COMPLIANT: Follow-up on letrozole therapy  INTERVAL HISTORY: [name] is a 76 year old  with above-mentioned history of right breast cancer treated with lumpectomy followed by adjuvant radiation. She is currently on antiestrogen therapy initially with anastrozole and later switched to letrozole. The reason we changed treatment was because she had trigger fingers pretty severely. It appears that she is tolerating letrozole much better. She no longer has a trigger finger. Energy levels have also markedly improved.  REVIEW OF SYSTEMS:   Constitutional: Denies fevers, chills or abnormal weight loss Eyes: Denies blurriness of vision Ears, nose, mouth, throat, and face: Denies mucositis or sore throat Respiratory: Denies cough, dyspnea or wheezes Cardiovascular: Denies palpitation, chest discomfort Gastrointestinal:  Denies nausea, heartburn or change in bowel habits Skin: Denies abnormal skin rashes Lymphatics: Denies new lymphadenopathy or easy bruising Neurological:Denies numbness, tingling or new weaknesses Behavioral/Psych: Mood is stable, no new changes  Extremities: No lower extremity edema Breast:  denies any pain or lumps or nodules in either breasts All other systems were reviewed with the patient and are negative.  I have reviewed the past medical history, past surgical history, social history and family history with the patient and they are unchanged from previous note.  ALLERGIES:  is allergic to statins.  MEDICATIONS:  Current Outpatient Prescriptions  Medication Sig Dispense Refill  . aspirin EC 81 MG tablet Take 1 tablet (81 mg total) by mouth daily. 30 tablet  1  . bimatoprost (LUMIGAN) 0.01 % SOLN Place 1 drop into both eyes at bedtime.     . Calcium-Vitamin D (CALTRATE 600 PLUS-VIT D PO) Take 2 tablets by mouth every evening.     . dorzolamide-timolol (COSOPT) 22.3-6.8 MG/ML ophthalmic solution Place 1 drop into both eyes 2 (two) times daily.     Marland Kitchen ezetimibe (ZETIA) 10 MG tablet Take 10 mg by mouth every evening.     Marland Kitchen letrozole (FEMARA) 2.5 MG tablet Take 1  tablet (2.5 mg total) by mouth daily. 90 tablet 3  . levothyroxine (SYNTHROID, LEVOTHROID) 112 MCG tablet Take 112 mcg by mouth daily before breakfast.   11  . multivitamin (THERAGRAN) per tablet Take 1 tablet by mouth daily.     . Omega-3 Fatty Acids (FISH OIL) 1000 MG CAPS Take 2,000 capsules by mouth 2 (two) times daily.    . polyethylene glycol (MIRALAX / GLYCOLAX) packet Take 17 g by mouth daily.    . vitamin E 400 UNIT capsule Take 400 Units by mouth every morning.     No current facility-administered medications for this visit.     PHYSICAL EXAMINATION: ECOG PERFORMANCE STATUS: 1 - Symptomatic but completely ambulatory  Vitals:   11/04/16 1445  BP: (!) 132/49  Pulse: 74  Resp: 16  Temp: 97.6 F (36.4 C)   Filed Weights   11/04/16 1445  Weight: 194 lb 9.6 oz (88.3 kg)    GENERAL:alert, no distress and comfortable SKIN: skin color, texture, turgor are normal, no rashes or significant lesions EYES: normal, Conjunctiva are pink and non-injected, sclera clear OROPHARYNX:no exudate, no erythema and lips, buccal mucosa, and tongue normal  NECK: supple, thyroid normal size, non-tender, without nodularity LYMPH:  no palpable lymphadenopathy in the cervical, axillary or inguinal LUNGS: clear to auscultation and percussion with normal breathing effort HEART: regular rate & rhythm and no murmurs and no lower extremity edema ABDOMEN:abdomen soft, non-tender and normal bowel sounds MUSCULOSKELETAL:no cyanosis of digits and no clubbing  NEURO: alert & oriented x 3 with fluent speech, no focal motor/sensory deficits EXTREMITIES: No lower extremity edema BREAST: No palpable masses or nodules in either right or left breasts. No palpable axillary supraclavicular or infraclavicular adenopathy no breast tenderness or nipple discharge. (exam performed in the presence of a chaperone)  LABORATORY DATA:  I have reviewed the data as listed   Chemistry      Component Value Date/Time   NA 139  05/24/2013 0940   K 3.6 05/24/2013 0940   CL 105 05/24/2013 0940   CO2 27 05/24/2013 0940   BUN 22 05/24/2013 0940   CREATININE 0.83 05/24/2013 0940      Component Value Date/Time   CALCIUM 8.8 05/24/2013 0940   ALKPHOS 44 05/16/2013 1013   AST 22 05/16/2013 1013   ALT 27 05/16/2013 1013   BILITOT 0.4 05/16/2013 1013       Lab Results  Component Value Date   WBC 15.4 (H) 06/06/2013   HGB 14.0 01/30/2016   HCT 32.1 (L) 06/06/2013   MCV 94.1 06/06/2013   PLT 538 (H) 06/06/2013   NEUTROABS 13.2 (H) 06/06/2013    ASSESSMENT & PLAN:  Breast cancer of upper-inner quadrant of right female breast Right lumpectomy 03/19/2015: IDC 1.7 cm with DCIS, micromet in one lymph node measuring 2 mm negative for ECS, other lymph node negative, T1cN1a stage II a, ER 99%, PR 98%, HER-2 negative, Ki-67 12%, Mammaprint low-risk luminal A, status post adjuvant radiation, started anastrozole 1 mg  daily 06/18/2015 changed to Letrozole 08/05/16  Letrozole toxicities: Trigger fingers which are painful and making it difficult for her to do crochet: These symptoms have resolved with letrozole. Patient denies any hot flashes. She needs a bone density every other year.  Breast cancer surveillance: 1. Breast exam 11/04/16: No palpable lumps or nodules 2. Annual mammograms 02/11/2016: Benign  Return to clinic in 1 year for follow-up  I spent 15 minutes talking to the patient of which more than half was spent in counseling and coordination of care.  No orders of the defined types were placed in this encounter.  The patient has a good understanding of the overall plan. she agrees with it. she will call with any problems that may develop before the next visit here.   Rulon Eisenmenger, MD 11/04/16

## 2016-12-28 ENCOUNTER — Other Ambulatory Visit: Payer: Self-pay | Admitting: Hematology and Oncology

## 2016-12-28 DIAGNOSIS — Z853 Personal history of malignant neoplasm of breast: Secondary | ICD-10-CM

## 2017-01-27 ENCOUNTER — Other Ambulatory Visit: Payer: Self-pay | Admitting: Physician Assistant

## 2017-01-27 ENCOUNTER — Ambulatory Visit
Admission: RE | Admit: 2017-01-27 | Discharge: 2017-01-27 | Disposition: A | Discharge status: Home / Self Care | Payer: Medicare Other | Source: Ambulatory Visit | Attending: Physician Assistant | Admitting: Physician Assistant

## 2017-01-27 DIAGNOSIS — S0993XA Unspecified injury of face, initial encounter: Secondary | ICD-10-CM

## 2017-02-11 ENCOUNTER — Ambulatory Visit
Admission: RE | Admit: 2017-02-11 | Discharge: 2017-02-11 | Disposition: A | Discharge status: Home / Self Care | Payer: Medicare Other | Source: Ambulatory Visit | Attending: Hematology and Oncology | Admitting: Hematology and Oncology

## 2017-02-11 DIAGNOSIS — Z853 Personal history of malignant neoplasm of breast: Secondary | ICD-10-CM

## 2017-02-11 HISTORY — DX: Personal history of irradiation: Z92.3

## 2017-02-19 HISTORY — PX: BREAST BIOPSY: SHX20

## 2017-07-22 ENCOUNTER — Other Ambulatory Visit: Payer: Self-pay | Admitting: Hematology and Oncology

## 2017-08-01 ENCOUNTER — Encounter: Payer: Self-pay | Admitting: Hematology and Oncology

## 2017-11-02 NOTE — Assessment & Plan Note (Signed)
Right lumpectomy 03/19/2015: IDC 1.7 cm with DCIS, micromet in one lymph node measuring 2 mm negative for ECS, other lymph node negative, T1cN1a stage II a, ER 99%, PR 98%, HER-2 negative, Ki-67 12%, Mammaprint low-risk luminal A, status post adjuvant radiation, started anastrozole 1 mg daily 06/18/2015 changed to Letrozole 08/05/16  Letrozole toxicities: Trigger fingers which are painful and making it difficult for her to do crochet: These symptoms have resolved with letrozole. Patient denies any hot flashes. She needs a bone density every other year.  Breast cancer surveillance: 1. Breast exam 11/03/2017: No palpable lumps or nodules 2. Annual mammograms 02/11/2016: Benign  Return to clinic in 1 year for follow-up

## 2017-11-03 ENCOUNTER — Inpatient Hospital Stay: Payer: Medicare Other | Attending: Hematology and Oncology | Admitting: Hematology and Oncology

## 2017-11-03 ENCOUNTER — Telehealth: Payer: Self-pay | Admitting: Hematology and Oncology

## 2017-11-03 DIAGNOSIS — C50211 Malignant neoplasm of upper-inner quadrant of right female breast: Secondary | ICD-10-CM | POA: Diagnosis present

## 2017-11-03 DIAGNOSIS — Z79811 Long term (current) use of aromatase inhibitors: Secondary | ICD-10-CM | POA: Insufficient documentation

## 2017-11-03 DIAGNOSIS — Z170 Estrogen receptor positive status [ER+]: Secondary | ICD-10-CM | POA: Insufficient documentation

## 2017-11-03 MED ORDER — MELOXICAM 15 MG PO TABS: 15.0000 mg | ORAL_TABLET | Freq: Every day | ORAL | Status: DC

## 2017-11-03 MED ORDER — LETROZOLE 2.5 MG PO TABS: 2.5000 mg | ORAL_TABLET | Freq: Every day | ORAL | 3 refills | Status: DC

## 2017-11-03 NOTE — Progress Notes (Signed)
Patient Care Team: Mayra Neer, MD as PCP - General (Family Medicine) Nicholas Lose, MD as Consulting Physician (Hematology and Oncology) Jovita Kussmaul, MD as Consulting Physician (General Surgery) Arloa Koh, MD as Consulting Physician (Radiation Oncology) Sylvan Cheese, NP as Nurse Practitioner (Hematology and Oncology)  DIAGNOSIS:  Encounter Diagnosis  Name Primary?  . Malignant neoplasm of upper-inner quadrant of right breast in female, estrogen receptor positive ([place])     SUMMARY OF ONCOLOGIC HISTORY:   Breast cancer of upper-inner quadrant of right female breast ([place])   02/03/2015 Mammogram    Right breast: possible abnormality requiring further imaging      02/06/2015 Breast US    Right breast: no correlate seen      02/20/2015 Initial Biopsy    Right breast core needle bx: Invasive ductal carcinoma, grade 1, with DCIS; ER+ (99%), PR+ (98%), HER2/neu negative (ratio 1.71), Ki67 12%.      02/25/2015 Breast MRI    Right breast: 1.3 x 0.6 x 0.6 cm bilobed area of nodularity in the right breast, post biopsy change      02/25/2015 Clinical Stage    Stage IA: T1c N0      03/19/2015 Definitive Surgery    Right lumpectomy / [facility] Marlou Starks): IDC 1.7 cm with DCIS, micromet in one lymph node measuring 2 mm negative for ECS, other lymph node negative;  ER+ (99%), PR+ (98%), HER-2 negative, Ki-67 12%      03/19/2015 Pathologic Stage    Stage IIA: pT1c pN1a       03/19/2015 Procedure    Mammaprint low risk luminal A      05/05/2015 - 06/05/2015 Radiation Therapy    Adjuvant RT Valere Dross): Right breast 48 Gy over 24 fractions       06/19/2015 -  Anti-estrogen oral therapy    Anastrozole 1 mg daily. Switched to letrozole 08/05/2016 due to trigger fingers      09/11/2015 Survivorship    Survivorship visit completed and copy of care plan provided to patient       CHIEF COMPLIANT: Follow-up on letrozole therapy  INTERVAL HISTORY: [name] is a 28-year  with above-mentioned history of right breast cancer treated with lumpectomy followed by radiation is currently on antiestrogen therapy.  She could not tolerate anastrozole because of trigger finger.  She is currently on letrozole.  She appears to be tolerating letrozole much better.  REVIEW OF SYSTEMS:   Constitutional: Denies fevers, chills or abnormal weight loss Eyes: Denies blurriness of vision Ears, nose, mouth, throat, and face: Denies mucositis or sore throat Respiratory: Denies cough, dyspnea or wheezes Cardiovascular: Denies palpitation, chest discomfort Gastrointestinal:  Denies nausea, heartburn or change in bowel habits Skin: Denies abnormal skin rashes Lymphatics: Denies new lymphadenopathy or easy bruising Neurological:Denies numbness, tingling or new weaknesses Behavioral/Psych: Mood is stable, no new changes  Extremities: No lower extremity edema Breast:  denies any pain or lumps or nodules in either breasts All other systems were reviewed with the patient and are negative.  I have reviewed the past medical history, past surgical history, social history and family history with the patient and they are unchanged from previous note.  ALLERGIES:  is allergic to statins.  MEDICATIONS:  Current Outpatient Medications  Medication Sig Dispense Refill  . aspirin EC 81 MG tablet Take 1 tablet (81 mg total) by mouth daily. 30 tablet 1  . bimatoprost (LUMIGAN) 0.01 % SOLN Place 1 drop into both eyes at bedtime.     Marland Kitchen  Calcium-Vitamin D (CALTRATE 600 PLUS-VIT D PO) Take 2 tablets by mouth every evening.     . dorzolamide-timolol (COSOPT) 22.3-6.8 MG/ML ophthalmic solution Place 1 drop into both eyes 2 (two) times daily.     Marland Kitchen ezetimibe (ZETIA) 10 MG tablet Take 10 mg by mouth every evening.     Marland Kitchen letrozole (FEMARA) 2.5 MG tablet TAKE 1 TABLET (2.5 MG TOTAL) BY MOUTH DAILY. 90 tablet 3  . levothyroxine (SYNTHROID, LEVOTHROID) 112 MCG tablet Take 112 mcg by mouth daily before  breakfast.   11  . meloxicam (MOBIC) 15 MG tablet Take 1 tablet (15 mg total) by mouth daily.    . multivitamin (THERAGRAN) per tablet Take 1 tablet by mouth daily.     . Omega-3 Fatty Acids (FISH OIL) 1000 MG CAPS Take 2,000 capsules by mouth 2 (two) times daily.    . polyethylene glycol (MIRALAX / GLYCOLAX) packet Take 17 g by mouth daily.     No current facility-administered medications for this visit.     PHYSICAL EXAMINATION: ECOG PERFORMANCE STATUS: 1 - Symptomatic but completely ambulatory  Vitals:   11/03/17 1406  BP: 129/63  Pulse: 65  Resp: 18  Temp: (!) 97.4 F (36.3 C)  SpO2: 100%   Filed Weights   11/03/17 1406  Weight: 181 lb 4.8 oz (82.2 kg)    GENERAL:alert, no distress and comfortable SKIN: skin color, texture, turgor are normal, no rashes or significant lesions EYES: normal, Conjunctiva are pink and non-injected, sclera clear OROPHARYNX:no exudate, no erythema and lips, buccal mucosa, and tongue normal  NECK: supple, thyroid normal size, non-tender, without nodularity LYMPH:  no palpable lymphadenopathy in the cervical, axillary or inguinal LUNGS: clear to auscultation and percussion with normal breathing effort HEART: regular rate & rhythm and no murmurs and no lower extremity edema ABDOMEN:abdomen soft, non-tender and normal bowel sounds MUSCULOSKELETAL:no cyanosis of digits and no clubbing  NEURO: alert & oriented x 3 with fluent speech, no focal motor/sensory deficits EXTREMITIES: No lower extremity edema BREAST: No palpable masses or nodules in either right or left breasts. No palpable axillary supraclavicular or infraclavicular adenopathy no breast tenderness or nipple discharge. (exam performed in the presence of a chaperone)  LABORATORY DATA:  I have reviewed the data as listed CMP Latest Ref Rng & Units 05/24/2013 05/16/2013 06/03/2011  Glucose 70 - 99 mg/dL 143(H) 136(H) 95  BUN 6 - 23 mg/dL '22 21 21  ' Creatinine 0.50 - 1.10 mg/dL 0.83 0.80 0.82    Sodium 135 - 145 mEq/L 139 140 137  Potassium 3.5 - 5.1 mEq/L 3.6 3.9 3.7  Chloride 96 - 112 mEq/L 105 106 101  CO2 19 - 32 mEq/L '27 25 29  ' Calcium 8.4 - 10.5 mg/dL 8.8 9.7 10.3  Total Protein 6.0 - 8.3 g/dL - 6.5 -  Total Bilirubin 0.3 - 1.2 mg/dL - 0.4 -  Alkaline Phos 39 - 117 U/L - 44 -  AST 0 - 37 U/L - 22 -  ALT 0 - 35 U/L - 27 -    Lab Results  Component Value Date   WBC 15.4 (H) 06/06/2013   HGB 14.0 01/30/2016   HCT 32.1 (L) 06/06/2013   MCV 94.1 06/06/2013   PLT 538 (H) 06/06/2013   NEUTROABS 13.2 (H) 06/06/2013    ASSESSMENT & PLAN:  Breast cancer of upper-inner quadrant of right female breast Right lumpectomy 03/19/2015: IDC 1.7 cm with DCIS, micromet in one lymph node measuring 2 mm negative for ECS, other lymph  node negative, T1cN1a stage II a, ER 99%, PR 98%, HER-2 negative, Ki-67 12%, Mammaprint low-risk luminal A, status post adjuvant radiation, started anastrozole 1 mg daily 06/18/2015 changed to Letrozole 08/05/16  Letrozole toxicities: Trigger fingers which are painful and making it difficult for her to do crochet: These symptoms have resolved with letrozole. Patient denies any hot flashes. She needs a bone density every other year.  Breast cancer surveillance: 1. Breast exam 11/03/2017: No palpable lumps or nodules 2. Annual mammograms 02/11/2016: Benign  Return to clinic in 1 year for follow-up   I spent 25 minutes talking to the patient of which more than half was spent in counseling and coordination of care.  No orders of the defined types were placed in this encounter.  The patient has a good understanding of the overall plan. she agrees with it. she will call with any problems that may develop before the next visit here.   Harriette Ohara, MD 11/03/17

## 2017-11-03 NOTE — Telephone Encounter (Signed)
Gave patient AVS and calendar of upcoming January 2020 appointments.

## 2018-01-02 ENCOUNTER — Other Ambulatory Visit: Payer: Self-pay | Admitting: Hematology and Oncology

## 2018-01-02 DIAGNOSIS — Z9889 Other specified postprocedural states: Secondary | ICD-10-CM

## 2018-02-20 ENCOUNTER — Ambulatory Visit
Admission: RE | Admit: 2018-02-20 | Discharge: 2018-02-20 | Disposition: A | Discharge status: Home / Self Care | Payer: Medicare Other | Source: Ambulatory Visit | Attending: Hematology and Oncology | Admitting: Hematology and Oncology

## 2018-02-20 DIAGNOSIS — Z9889 Other specified postprocedural states: Secondary | ICD-10-CM

## 2018-04-05 ENCOUNTER — Other Ambulatory Visit: Payer: Self-pay | Admitting: Physician Assistant

## 2018-04-05 ENCOUNTER — Ambulatory Visit
Admission: RE | Admit: 2018-04-05 | Discharge: 2018-04-05 | Disposition: A | Discharge status: Home / Self Care | Payer: Medicare Other | Source: Ambulatory Visit | Attending: Physician Assistant | Admitting: Physician Assistant

## 2018-04-05 DIAGNOSIS — M25552 Pain in left hip: Secondary | ICD-10-CM

## 2018-04-20 ENCOUNTER — Encounter (INDEPENDENT_AMBULATORY_CARE_PROVIDER_SITE_OTHER): Payer: Self-pay | Admitting: Orthopaedic Surgery

## 2018-04-20 ENCOUNTER — Ambulatory Visit (INDEPENDENT_AMBULATORY_CARE_PROVIDER_SITE_OTHER): Payer: Medicare Other | Admitting: Orthopaedic Surgery

## 2018-04-20 DIAGNOSIS — M1612 Unilateral primary osteoarthritis, left hip: Secondary | ICD-10-CM | POA: Diagnosis not present

## 2018-04-20 DIAGNOSIS — M25552 Pain in left hip: Secondary | ICD-10-CM | POA: Insufficient documentation

## 2018-04-20 NOTE — Progress Notes (Signed)
Office Visit Note   Patient: [name]           Date of Birth: Aug 29, 1941           MRN: [id number] Visit Date: 04/20/2018              Requested by: Mayra Neer, MD [address] [place], [place] [address] PCP: Mayra Neer, MD   Assessment & Plan: Visit Diagnoses:  1. Primary osteoarthritis of left hip     Plan: Impression is left hip primary localized osteoarthritis.  At this point, the patient would like to proceed with a left total hip arthroplasty.  Risks, benefits and possible complications reviewed.  Rehab and recovery time discussed.  All questions were answered.  We will see her 2 weeks postop.  Call with concerns or questions in the meantime.  Patient has severe difficulties with ADLs and chronic pain with failure of conservative treatment.  Follow-Up Instructions: Return for 2 weeks post-op.   Orders:  No orders of the defined types were placed in this encounter.  No orders of the defined types were placed in this encounter.     Procedures: No procedures performed   Clinical Data: No additional findings.   Subjective: Chief Complaint  Patient presents with  . Left Hip - Pain    HPI patient is a pleasant 77 year old female who presents to our clinic today with left hip pain.  This began approximately 5 to 6 weeks ago without any known injury or change in activity.  Pain she has is to her groin, anterior thigh and occasionally in her to her left buttocks.  This is intermittent in nature but becomes more constant with increased activity or when she is on her feet all day.  She does have pain with external rotation of the hip.  No night pain.  She has tried prednisone with minimal relief of symptoms.  No numbness, tingling or burning to the left lower extremity.  She was seen by her primary care provider where x-rays were obtained.  These showed severe osteoarthritis to the left hip.  No previous cortisone injection.  She does have a  history of breast cancer and hypertension, but otherwise is fairly healthy.  She is on aspirin 81 mg daily.  Review of Systems as detailed in HPI.  All others are negative.   Objective: Vital Signs: There were no vitals taken for this visit.  Physical Exam well-developed and well-nourished female no acute distress.  Alert and oriented x3.  Ortho Exam examination of her left hip reveals a markedly positive logroll with decreased rotation.  Negative straight leg raise.  She is neurovascularly intact distally.  Specialty Comments:  No specialty comments available.  Imaging: No new imaging today   PMFS History: Patient Active Problem List   Diagnosis Date Noted  . Pain of left hip joint 04/20/2018  . Breast cancer of upper-inner quadrant of right female breast ([place]) 03/04/2015   Past Medical History:  Diagnosis Date  . Breast cancer [facility]) dx Apr 2016--  oncologist-  dr Soyla Dryer   right breast Stage 2A (pT1c pN1a) DCIS ,Grade 1, ER/PR +,  s/p  right mastectomy w/ node dissection 03-19-2015 and Adjuvant RXT (05-05-2015 to 06-05-2015)/  currently anit-estrogen therapy w/ anastrozole  . External hemorrhoid, bleeding   . First degree heart block   . Glaucoma, both eyes   . History of palpitations   . 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  Internal hemorrhoid, bleeding   . Personal history of radiation therapy 2016  . S/P radiation therapy 05/05/2015 through 06/05/2015   Right breast (high tangents) 4800 cGy in 24 sessions , no boost    . Wears glasses   . Wears hearing aid    bilateral    Family History  Adopted: Yes    Past Surgical History:  Procedure Laterality Date  . ANTERIOR AND POSTERIOR REPAIR  06/10/2011   Procedure: ANTERIOR (CYSTOCELE) AND POSTERIOR REPAIR (RECTOCELE);  Surgeon: Blane Ohara Meisinger;  Location: [facility];  Service: Gynecology;  Laterality: N/A;  . APPENDECTOMY  as child  . BREAST BIOPSY Right 02/19/2017  .  BREAST LUMPECTOMY Right 02/2015  . BREAST SURGERY Right 03/19/15   lumpectomy/2 nodes  . CARPAL TUNNEL RELEASE Left   . CHOLECYSTECTOMY OPEN  1974  . EVALUATION UNDER ANESTHESIA WITH HEMORRHOIDECTOMY N/A 01/30/2016   Procedure: EXAM UNDER ANESTHESIA POSSIBLE HEMORRHOIDAL PEXY SINGLE COLUMN  HEMORRHOIDECTOMY;  Surgeon: [name], MD;  Location: [place];  Service: General;  Laterality: N/A;  . HAMMER TOE SURGERY Left   . KNEE ARTHROSCOPY Right   . ORIF RIGHT ANKLE FX  1999  . RADIOACTIVE SEED GUIDED PARTIAL MASTECTOMY WITH AXILLARY SENTINEL LYMPH NODE BIOPSY Right 03/19/2015   Procedure: RIGHT BREAST LUMPECTOMY WITH RADIOACTIVE SEED AND SENTINEL LYMPH NODE MAPPING;  Surgeon: Autumn Messing III, MD;  Location: [place];  Service: General;  Laterality: Right;  . TONSILLECTOMY  as child  . TOTAL KNEE ARTHROPLASTY Right 05/23/2013   Procedure: Right Total Knee Arthroplasty;  Surgeon: Newt Minion, MD;  Location: [place];  Service: Orthopedics;  Laterality: Right;  Right Total Knee Arthroplasty  . TUBAL LIGATION    . VAGINAL HYSTERECTOMY  07-07-2010   w/  Anterior & Posterior Repair and Solyx mid-urethral sling  . VAGINAL PROLAPSE REPAIR  06/10/2011   Procedure: VAGINAL VAULT SUSPENSION;  Surgeon: Blane Ohara Meisinger;  Location: [facility];  Service: Gynecology;  Laterality: N/A;   Social History   Occupational History  . Not on file  Tobacco Use  . Smoking status: Never Smoker  . Smokeless tobacco: Never Used  Substance and Sexual Activity  . Alcohol use: No  . Drug use: No  . Sexual activity: Not on file

## 2018-05-02 ENCOUNTER — Other Ambulatory Visit (INDEPENDENT_AMBULATORY_CARE_PROVIDER_SITE_OTHER): Payer: Self-pay

## 2018-05-03 NOTE — Pre-Procedure Instructions (Addendum)
   [name]  05/03/2018     [id number] [place], [place] [phone] [facility] [address] [place] [address] Phone: [phone] Fax: [phone]   Your procedure is scheduled on Monday, May 15, 2018  Report to [facility] Admitting at 5:30 A.M.  Call this number if you have problems the morning of surgery:  [phone]   Remember: Follow your surgeon's instructions regarding Aspirin; if no pre-op instructions were provided, contact your surgeon.  Do not eat or drink after midnight Sunday, May 14, 2018  Take these medicines the morning of surgery with A SIP OF WATER: levothyroxine (SYNTHROID), letrozole [facility]), dorzolamide-timolol (COSOPT) eye drops  Stop taking 7 days prior to surgery: vitamins, fish oil and herbal medications. Do not take any NSAIDs ie: Ibuprofen, Advil, Naproxen (Aleve), Motrin,  BC's, Goody Powder and meloxicam (MOBIC).  Do not wear jewelry, make-up or nail polish.  Do not wear lotions, powders, or perfumes, or deodorant.  Do not shave 48 hours prior to surgery.    Do not bring valuables to the hospital.  [facility] is not responsible for any belongings or valuables.  Contacts, dentures or bridgework may not be worn into surgery.  Leave your suitcase in the car.  After surgery it may be brought to your room. Special instructions:shower the night before surgery and the morning of surgery with CHG Please read over the following fact sheets that you were given. Pain Booklet, Coughing and Deep Breathing, Total Joint Packet, MRSA Information and Surgical Site Infection Prevention

## 2018-05-04 ENCOUNTER — Ambulatory Visit (HOSPITAL_COMMUNITY)
Admission: RE | Admit: 2018-05-04 | Discharge: 2018-05-04 | Disposition: A | Discharge status: Home / Self Care | Payer: Medicare Other | Source: Ambulatory Visit | Attending: Physician Assistant | Admitting: Physician Assistant

## 2018-05-04 ENCOUNTER — Encounter (HOSPITAL_COMMUNITY)
Admission: RE | Admit: 2018-05-04 | Discharge: 2018-05-04 | Disposition: A | Discharge status: Home / Self Care | Payer: Medicare Other | Source: Ambulatory Visit | Attending: Orthopaedic Surgery | Admitting: Orthopaedic Surgery

## 2018-05-04 ENCOUNTER — Other Ambulatory Visit: Payer: Self-pay

## 2018-05-04 ENCOUNTER — Encounter (HOSPITAL_COMMUNITY): Payer: Self-pay

## 2018-05-04 DIAGNOSIS — I4510 Unspecified right bundle-branch block: Secondary | ICD-10-CM | POA: Insufficient documentation

## 2018-05-04 DIAGNOSIS — R918 Other nonspecific abnormal finding of lung field: Secondary | ICD-10-CM | POA: Diagnosis not present

## 2018-05-04 DIAGNOSIS — Z01810 Encounter for preprocedural cardiovascular examination: Secondary | ICD-10-CM | POA: Insufficient documentation

## 2018-05-04 DIAGNOSIS — Z01812 Encounter for preprocedural laboratory examination: Secondary | ICD-10-CM | POA: Insufficient documentation

## 2018-05-04 DIAGNOSIS — Z923 Personal history of irradiation: Secondary | ICD-10-CM | POA: Insufficient documentation

## 2018-05-04 DIAGNOSIS — Z01818 Encounter for other preprocedural examination: Secondary | ICD-10-CM | POA: Diagnosis present

## 2018-05-04 DIAGNOSIS — M1612 Unilateral primary osteoarthritis, left hip: Secondary | ICD-10-CM | POA: Insufficient documentation

## 2018-05-04 DIAGNOSIS — Z853 Personal history of malignant neoplasm of breast: Secondary | ICD-10-CM | POA: Insufficient documentation

## 2018-05-04 HISTORY — DX: Headache, unspecified: R51.9

## 2018-05-04 HISTORY — DX: Headache: R51

## 2018-05-04 HISTORY — DX: Unilateral primary osteoarthritis, left hip: M16.12

## 2018-05-04 LAB — COMPREHENSIVE METABOLIC PANEL
ALBUMIN: 3.7 g/dL (ref 3.5–5.0)
ALT: 16 U/L (ref 0–44)
AST: 21 U/L (ref 15–41)
Alkaline Phosphatase: 74 U/L (ref 38–126)
Anion gap: 9 (ref 5–15)
BUN: 13 mg/dL (ref 8–23)
CHLORIDE: 106 mmol/L (ref 98–111)
CO2: 26 mmol/L (ref 22–32)
CREATININE: 0.76 mg/dL (ref 0.44–1.00)
Calcium: 9.3 mg/dL (ref 8.9–10.3)
GFR calc Af Amer: >60 mL/min (ref >60)
GFR calc non Af Amer: >60 mL/min (ref >60)
Glucose, Bld: 115 mg/dL — ABNORMAL HIGH (ref 70–99)
Potassium: 3.8 mmol/L (ref 3.5–5.1)
SODIUM: 141 mmol/L (ref 135–145)
Total Bilirubin: 1.1 mg/dL (ref 0.3–1.2)
Total Protein: 6.8 g/dL (ref 6.5–8.1)

## 2018-05-04 LAB — SURGICAL PCR SCREEN
MRSA, PCR: NEGATIVE
STAPHYLOCOCCUS AUREUS: NEGATIVE

## 2018-05-04 LAB — CBC WITH DIFFERENTIAL/PLATELET
ABS IMMATURE GRANULOCYTES: 0 10*3/uL (ref 0.0–0.1)
BASOS ABS: 0 10*3/uL (ref 0.0–0.1)
BASOS PCT: 0 %
EOS ABS: 0.4 10*3/uL (ref 0.0–0.7)
Eosinophils Relative: 6 %
HCT: 41.1 % (ref 36.0–46.0)
Hemoglobin: 13.1 g/dL (ref 12.0–15.0)
IMMATURE GRANULOCYTES: 0 %
Lymphocytes Relative: 37 %
Lymphs Abs: 2.4 10*3/uL (ref 0.7–4.0)
MCH: 32.5 pg (ref 26.0–34.0)
MCHC: 31.9 g/dL (ref 30.0–36.0)
MCV: 102 fL — ABNORMAL HIGH (ref 78.0–100.0)
MONO ABS: 0.6 10*3/uL (ref 0.1–1.0)
Monocytes Relative: 10 %
NEUTROS ABS: 3 10*3/uL (ref 1.7–7.7)
NEUTROS PCT: 47 %
PLATELETS: 229 10*3/uL (ref 150–400)
RBC: 4.03 MIL/uL (ref 3.87–5.11)
RDW: 12.9 % (ref 11.5–15.5)
WBC: 6.3 10*3/uL (ref 4.0–10.5)

## 2018-05-04 LAB — TYPE AND SCREEN
ABO/RH(D): A POS
Antibody Screen: NEGATIVE

## 2018-05-04 LAB — PROTIME-INR
INR: 1
Prothrombin Time: 13.1 seconds (ref 11.4–15.2)

## 2018-05-04 LAB — APTT: APTT: 27 s (ref 24–36)

## 2018-05-04 NOTE — Progress Notes (Signed)
Pt denies SOB, chest pain, and being under the care of a cardiologist. Pt denies having a stress test, echo and cardiac cath. Pt denies having an EKG and chest x ray within the last year. Pt denies recent labs. Pt stated that her last dose of Aspirin will be 05/06/18.

## 2018-05-12 MED ORDER — TRANEXAMIC ACID 1000 MG/10ML IV SOLN
1000.0000 mg | INTRAVENOUS | Status: AC
  Administered 2018-05-15: 1000 mg via INTRAVENOUS
  Filled 2018-05-12: qty 1100

## 2018-05-12 MED ORDER — TRANEXAMIC ACID 1000 MG/10ML IV SOLN
2000.0000 mg | INTRAVENOUS | Status: DC
  Filled 2018-05-12: qty 20

## 2018-05-14 NOTE — Anesthesia Preprocedure Evaluation (Addendum)
Anesthesia Evaluation  Patient identified by MRN, date of birth, ID band Patient awake    Reviewed: Allergy & Precautions, NPO status , Patient's Chart, lab work & pertinent test results  Airway Mallampati: II  TM Distance: >3 FB Neck ROM: Full    Dental no notable dental hx. (+) Dental Advisory Given, Partial Lower, Partial Upper   Pulmonary neg pulmonary ROS,    Pulmonary exam normal breath sounds clear to auscultation       Cardiovascular Exercise Tolerance: Good negative cardio ROS Normal cardiovascular exam Rhythm:Regular Rate:Normal     Neuro/Psych negative psych ROS   GI/Hepatic negative GI ROS, Neg liver ROS,   Endo/Other  Hypothyroidism   Renal/GU negative Renal ROS     Musculoskeletal  (+) Arthritis ,   Abdominal   Peds  Hematology negative hematology ROS (+)   Anesthesia Other Findings   Reproductive/Obstetrics                            Lab Results  Component Value Date   WBC 6.3 05/04/2018   HGB 13.1 05/04/2018   HCT 41.1 05/04/2018   MCV 102.0 (H) 05/04/2018   PLT 229 05/04/2018    Anesthesia Physical Anesthesia Plan  ASA: II  Anesthesia Plan: Spinal   Post-op Pain Management:    Induction:   PONV Risk Score and Plan: Treatment may vary due to age or medical condition  Airway Management Planned: Mask, Natural Airway and Nasal Cannula  Additional Equipment:   Intra-op Plan:   Post-operative Plan:   Informed Consent: I have reviewed the patients History and Physical, chart, labs and discussed the procedure including the risks, benefits and alternatives for the proposed anesthesia with the patient or authorized representative who has indicated his/her understanding and acceptance.     Plan Discussed with: CRNA and Anesthesiologist  Anesthesia Plan Comments:         Anesthesia Quick Evaluation

## 2018-05-15 ENCOUNTER — Encounter (HOSPITAL_COMMUNITY): Payer: Self-pay | Admitting: Anesthesiology

## 2018-05-15 ENCOUNTER — Encounter (HOSPITAL_COMMUNITY)
Admission: RE | Disposition: A | Discharge status: Home Health | Payer: Self-pay | Source: Ambulatory Visit | Attending: Orthopaedic Surgery

## 2018-05-15 ENCOUNTER — Inpatient Hospital Stay (HOSPITAL_COMMUNITY): Payer: Medicare Other | Admitting: Anesthesiology

## 2018-05-15 ENCOUNTER — Other Ambulatory Visit: Payer: Self-pay

## 2018-05-15 ENCOUNTER — Inpatient Hospital Stay (HOSPITAL_COMMUNITY): Payer: Medicare Other

## 2018-05-15 ENCOUNTER — Inpatient Hospital Stay (HOSPITAL_COMMUNITY)
Admission: RE | Admit: 2018-05-15 | Discharge: 2018-05-16 | DRG: 470 | Disposition: A | Discharge status: Home Health | Payer: Medicare Other | Source: Ambulatory Visit | Attending: Orthopaedic Surgery | Admitting: Orthopaedic Surgery

## 2018-05-15 DIAGNOSIS — I440 Atrioventricular block, first degree: Secondary | ICD-10-CM | POA: Diagnosis present

## 2018-05-15 DIAGNOSIS — Z923 Personal history of irradiation: Secondary | ICD-10-CM | POA: Diagnosis not present

## 2018-05-15 DIAGNOSIS — M1612 Unilateral primary osteoarthritis, left hip: Secondary | ICD-10-CM

## 2018-05-15 DIAGNOSIS — Z96649 Presence of unspecified artificial hip joint: Secondary | ICD-10-CM

## 2018-05-15 DIAGNOSIS — D62 Acute posthemorrhagic anemia: Secondary | ICD-10-CM | POA: Diagnosis present

## 2018-05-15 DIAGNOSIS — H409 Unspecified glaucoma: Secondary | ICD-10-CM | POA: Diagnosis present

## 2018-05-15 DIAGNOSIS — Z419 Encounter for procedure for purposes other than remedying health state, unspecified: Secondary | ICD-10-CM

## 2018-05-15 DIAGNOSIS — Z791 Long term (current) use of non-steroidal anti-inflammatories (NSAID): Secondary | ICD-10-CM

## 2018-05-15 DIAGNOSIS — E890 Postprocedural hypothyroidism: Secondary | ICD-10-CM | POA: Diagnosis present

## 2018-05-15 DIAGNOSIS — Z96651 Presence of right artificial knee joint: Secondary | ICD-10-CM | POA: Diagnosis present

## 2018-05-15 DIAGNOSIS — Z79899 Other long term (current) drug therapy: Secondary | ICD-10-CM | POA: Diagnosis not present

## 2018-05-15 DIAGNOSIS — Z888 Allergy status to other drugs, medicaments and biological substances status: Secondary | ICD-10-CM

## 2018-05-15 HISTORY — PX: TOTAL HIP ARTHROPLASTY: SHX124

## 2018-05-15 SURGERY — ARTHROPLASTY, HIP, TOTAL, ANTERIOR APPROACH
Anesthesia: Spinal | Site: Hip | Laterality: Left

## 2018-05-15 MED ORDER — ALUM & MAG HYDROXIDE-SIMETH 200-200-20 MG/5ML PO SUSP: 30.0000 mL | ORAL | Status: DC | PRN

## 2018-05-15 MED ORDER — OXYCODONE HCL ER 10 MG PO T12A: 10.0000 mg | EXTENDED_RELEASE_TABLET | Freq: Two times a day (BID) | ORAL | 0 refills | Status: AC

## 2018-05-15 MED ORDER — POLYETHYLENE GLYCOL 3350 17 G PO PACK: 17.0000 g | PACK | Freq: Every day | ORAL | Status: DC | PRN

## 2018-05-15 MED ORDER — CHLORHEXIDINE GLUCONATE 4 % EX LIQD: 60.0000 mL | Freq: Once | CUTANEOUS | Status: DC

## 2018-05-15 MED ORDER — ACETAMINOPHEN 500 MG PO TABS
1000.0000 mg | ORAL_TABLET | Freq: Four times a day (QID) | ORAL | Status: AC
  Administered 2018-05-15 – 2018-05-16 (×4): 1000 mg via ORAL
  Filled 2018-05-15 (×4): qty 2

## 2018-05-15 MED ORDER — ASPIRIN 81 MG PO CHEW
81.0000 mg | CHEWABLE_TABLET | Freq: Two times a day (BID) | ORAL | Status: DC
  Administered 2018-05-16: 81 mg via ORAL
  Filled 2018-05-15: qty 1

## 2018-05-15 MED ORDER — DEXAMETHASONE SODIUM PHOSPHATE 10 MG/ML IJ SOLN
INTRAMUSCULAR | Status: DC | PRN
  Administered 2018-05-15: 4 mg via INTRAVENOUS

## 2018-05-15 MED ORDER — KETOROLAC TROMETHAMINE 15 MG/ML IJ SOLN
30.0000 mg | Freq: Four times a day (QID) | INTRAMUSCULAR | Status: DC
  Administered 2018-05-15: 30 mg via INTRAVENOUS
  Filled 2018-05-15: qty 2

## 2018-05-15 MED ORDER — METHOCARBAMOL 500 MG PO TABS: 500.0000 mg | ORAL_TABLET | Freq: Four times a day (QID) | ORAL | 2 refills | Status: DC | PRN

## 2018-05-15 MED ORDER — HYDROMORPHONE HCL 1 MG/ML IJ SOLN: 0.5000 mg | INTRAMUSCULAR | Status: DC | PRN

## 2018-05-15 MED ORDER — LEVOTHYROXINE SODIUM 100 MCG PO TABS
100.0000 ug | ORAL_TABLET | Freq: Every day | ORAL | Status: DC
  Administered 2018-05-16: 100 ug via ORAL
  Filled 2018-05-15: qty 1

## 2018-05-15 MED ORDER — VANCOMYCIN HCL 1000 MG IV SOLR
INTRAVENOUS | Status: AC | PRN
  Administered 2018-05-15: 1000 mg via INTRAVENOUS

## 2018-05-15 MED ORDER — LACTATED RINGERS IV SOLN: INTRAVENOUS | Status: DC

## 2018-05-15 MED ORDER — SORBITOL 70 % SOLN: 30.0000 mL | Freq: Every day | Status: DC | PRN

## 2018-05-15 MED ORDER — BUPIVACAINE IN DEXTROSE 0.75-8.25 % IT SOLN
INTRATHECAL | Status: DC | PRN
  Administered 2018-05-15: 2 mL via INTRATHECAL

## 2018-05-15 MED ORDER — 0.9 % SODIUM CHLORIDE (POUR BTL) OPTIME
TOPICAL | Status: DC | PRN
  Administered 2018-05-15: 1000 mL

## 2018-05-15 MED ORDER — KETOROLAC TROMETHAMINE 15 MG/ML IJ SOLN
15.0000 mg | Freq: Four times a day (QID) | INTRAMUSCULAR | Status: AC
  Administered 2018-05-15 – 2018-05-16 (×3): 15 mg via INTRAVENOUS
  Filled 2018-05-15 (×3): qty 1

## 2018-05-15 MED ORDER — PHENOL 1.4 % MT LIQD: 1.0000 | OROMUCOSAL | Status: DC | PRN

## 2018-05-15 MED ORDER — DEXAMETHASONE SODIUM PHOSPHATE 10 MG/ML IJ SOLN
10.0000 mg | Freq: Once | INTRAMUSCULAR | Status: DC
  Filled 2018-05-15: qty 1

## 2018-05-15 MED ORDER — PHENYLEPHRINE 40 MCG/ML (10ML) SYRINGE FOR IV PUSH (FOR BLOOD PRESSURE SUPPORT)
PREFILLED_SYRINGE | INTRAVENOUS | Status: AC
  Filled 2018-05-15: qty 10

## 2018-05-15 MED ORDER — DORZOLAMIDE HCL-TIMOLOL MAL 2-0.5 % OP SOLN
1.0000 [drp] | Freq: Two times a day (BID) | OPHTHALMIC | Status: DC
  Administered 2018-05-16: 1 [drp] via OPHTHALMIC
  Filled 2018-05-15: qty 10

## 2018-05-15 MED ORDER — SODIUM CHLORIDE 0.9 % IV SOLN
INTRAVENOUS | Status: DC | PRN
  Administered 2018-05-15: 25 ug/min via INTRAVENOUS

## 2018-05-15 MED ORDER — ONDANSETRON HCL 4 MG PO TABS: 4.0000 mg | ORAL_TABLET | Freq: Four times a day (QID) | ORAL | Status: DC | PRN

## 2018-05-15 MED ORDER — ONDANSETRON HCL 4 MG/2ML IJ SOLN: 4.0000 mg | Freq: Four times a day (QID) | INTRAMUSCULAR | Status: DC | PRN

## 2018-05-15 MED ORDER — ONDANSETRON HCL 4 MG/2ML IJ SOLN
INTRAMUSCULAR | Status: DC | PRN
  Administered 2018-05-15: 4 mg via INTRAVENOUS

## 2018-05-15 MED ORDER — MIDAZOLAM HCL 5 MG/5ML IJ SOLN
INTRAMUSCULAR | Status: DC | PRN
  Administered 2018-05-15 (×2): 1 mg via INTRAVENOUS

## 2018-05-15 MED ORDER — MENTHOL 3 MG MT LOZG: 1.0000 | LOZENGE | OROMUCOSAL | Status: DC | PRN

## 2018-05-15 MED ORDER — DEXAMETHASONE SODIUM PHOSPHATE 10 MG/ML IJ SOLN
INTRAMUSCULAR | Status: AC
  Filled 2018-05-15: qty 1

## 2018-05-15 MED ORDER — OXYCODONE HCL 5 MG PO TABS: 5.0000 mg | ORAL_TABLET | ORAL | 0 refills | Status: DC | PRN

## 2018-05-15 MED ORDER — MIDAZOLAM HCL 2 MG/2ML IJ SOLN
INTRAMUSCULAR | Status: AC
  Filled 2018-05-15: qty 2

## 2018-05-15 MED ORDER — DIPHENHYDRAMINE HCL 12.5 MG/5ML PO ELIX: 25.0000 mg | ORAL_SOLUTION | ORAL | Status: DC | PRN

## 2018-05-15 MED ORDER — HYDROCODONE-ACETAMINOPHEN 7.5-325 MG PO TABS: 1.0000 | ORAL_TABLET | Freq: Once | ORAL | Status: DC | PRN

## 2018-05-15 MED ORDER — DOCUSATE SODIUM 100 MG PO CAPS
100.0000 mg | ORAL_CAPSULE | Freq: Two times a day (BID) | ORAL | Status: DC
  Administered 2018-05-15: 100 mg via ORAL
  Filled 2018-05-15 (×2): qty 1

## 2018-05-15 MED ORDER — OXYCODONE HCL ER 10 MG PO T12A
10.0000 mg | EXTENDED_RELEASE_TABLET | Freq: Two times a day (BID) | ORAL | Status: DC
  Administered 2018-05-15: 10 mg via ORAL
  Filled 2018-05-15 (×2): qty 1

## 2018-05-15 MED ORDER — PROPOFOL 10 MG/ML IV BOLUS
INTRAVENOUS | Status: DC | PRN
  Administered 2018-05-15: 25 mg via INTRAVENOUS
  Administered 2018-05-15: 30 mg via INTRAVENOUS

## 2018-05-15 MED ORDER — LACTATED RINGERS IV SOLN
INTRAVENOUS | Status: DC | PRN
  Administered 2018-05-15: 07:00:00 via INTRAVENOUS

## 2018-05-15 MED ORDER — FENTANYL CITRATE (PF) 250 MCG/5ML IJ SOLN
INTRAMUSCULAR | Status: AC
Start: 2018-05-15 — End: ?
  Filled 2018-05-15: qty 5

## 2018-05-15 MED ORDER — SENNOSIDES-DOCUSATE SODIUM 8.6-50 MG PO TABS: 1.0000 | ORAL_TABLET | Freq: Every evening | ORAL | 1 refills | Status: AC | PRN

## 2018-05-15 MED ORDER — MAGNESIUM CITRATE PO SOLN: 1.0000 | Freq: Once | ORAL | Status: DC | PRN

## 2018-05-15 MED ORDER — PROMETHAZINE HCL 25 MG PO TABS: 25.0000 mg | ORAL_TABLET | Freq: Four times a day (QID) | ORAL | 1 refills | Status: DC | PRN

## 2018-05-15 MED ORDER — PROPOFOL 10 MG/ML IV BOLUS
INTRAVENOUS | Status: AC
  Filled 2018-05-15: qty 20

## 2018-05-15 MED ORDER — LIDOCAINE 2% (20 MG/ML) 5 ML SYRINGE
INTRAMUSCULAR | Status: AC
  Filled 2018-05-15: qty 5

## 2018-05-15 MED ORDER — CEFAZOLIN SODIUM-DEXTROSE 2-4 GM/100ML-% IV SOLN
2.0000 g | Freq: Four times a day (QID) | INTRAVENOUS | Status: AC
  Administered 2018-05-15 (×3): 2 g via INTRAVENOUS
  Filled 2018-05-15 (×3): qty 100

## 2018-05-15 MED ORDER — GABAPENTIN 300 MG PO CAPS
300.0000 mg | ORAL_CAPSULE | Freq: Three times a day (TID) | ORAL | Status: DC
  Administered 2018-05-15 (×3): 300 mg via ORAL
  Filled 2018-05-15 (×4): qty 1

## 2018-05-15 MED ORDER — METHOCARBAMOL 500 MG PO TABS: 500.0000 mg | ORAL_TABLET | Freq: Four times a day (QID) | ORAL | Status: DC | PRN

## 2018-05-15 MED ORDER — MEPERIDINE HCL 50 MG/ML IJ SOLN: 6.2500 mg | INTRAMUSCULAR | Status: DC | PRN

## 2018-05-15 MED ORDER — ONDANSETRON HCL 4 MG PO TABS: 4.0000 mg | ORAL_TABLET | Freq: Three times a day (TID) | ORAL | 0 refills | Status: DC | PRN

## 2018-05-15 MED ORDER — ONDANSETRON HCL 4 MG/2ML IJ SOLN
INTRAMUSCULAR | Status: AC
  Filled 2018-05-15: qty 2

## 2018-05-15 MED ORDER — ACETAMINOPHEN 325 MG PO TABS: 325.0000 mg | ORAL_TABLET | Freq: Four times a day (QID) | ORAL | Status: DC | PRN

## 2018-05-15 MED ORDER — OXYCODONE HCL 5 MG PO TABS: 10.0000 mg | ORAL_TABLET | ORAL | Status: DC | PRN

## 2018-05-15 MED ORDER — CEFAZOLIN SODIUM-DEXTROSE 2-4 GM/100ML-% IV SOLN
2.0000 g | INTRAVENOUS | Status: AC
  Administered 2018-05-15: 2 g via INTRAVENOUS
  Filled 2018-05-15: qty 100

## 2018-05-15 MED ORDER — HYDROMORPHONE HCL 1 MG/ML IJ SOLN: 0.2500 mg | INTRAMUSCULAR | Status: DC | PRN

## 2018-05-15 MED ORDER — SODIUM CHLORIDE 0.9 % IR SOLN
Status: DC | PRN
  Administered 2018-05-15: 3000 mL

## 2018-05-15 MED ORDER — OXYCODONE HCL 5 MG PO TABS: 5.0000 mg | ORAL_TABLET | ORAL | Status: DC | PRN

## 2018-05-15 MED ORDER — TRANEXAMIC ACID 1000 MG/10ML IV SOLN
INTRAVENOUS | Status: DC | PRN
  Administered 2018-05-15: 2000 mg via TOPICAL

## 2018-05-15 MED ORDER — PROPOFOL 500 MG/50ML IV EMUL
INTRAVENOUS | Status: DC | PRN
  Administered 2018-05-15: 50 ug/kg/min via INTRAVENOUS

## 2018-05-15 MED ORDER — ASPIRIN EC 81 MG PO TBEC: 81.0000 mg | DELAYED_RELEASE_TABLET | Freq: Two times a day (BID) | ORAL | 0 refills | Status: DC

## 2018-05-15 MED ORDER — METHOCARBAMOL 1000 MG/10ML IJ SOLN
500.0000 mg | Freq: Four times a day (QID) | INTRAVENOUS | Status: DC | PRN
  Filled 2018-05-15: qty 5

## 2018-05-15 MED ORDER — SODIUM CHLORIDE 0.9 % IV SOLN
INTRAVENOUS | Status: DC
  Administered 2018-05-15 – 2018-05-16 (×2): via INTRAVENOUS

## 2018-05-15 MED ORDER — ACETAMINOPHEN 10 MG/ML IV SOLN: 1000.0000 mg | Freq: Once | INTRAVENOUS | Status: DC | PRN

## 2018-05-15 MED ORDER — METOCLOPRAMIDE HCL 5 MG/ML IJ SOLN: 5.0000 mg | Freq: Three times a day (TID) | INTRAMUSCULAR | Status: DC | PRN

## 2018-05-15 MED ORDER — TRANEXAMIC ACID 1000 MG/10ML IV SOLN: 1000.0000 mg | Freq: Once | INTRAVENOUS | Status: DC

## 2018-05-15 MED ORDER — METOCLOPRAMIDE HCL 5 MG PO TABS: 5.0000 mg | ORAL_TABLET | Freq: Three times a day (TID) | ORAL | Status: DC | PRN

## 2018-05-15 MED ORDER — ONDANSETRON HCL 4 MG/2ML IJ SOLN: 4.0000 mg | Freq: Once | INTRAMUSCULAR | Status: DC | PRN

## 2018-05-15 MED ORDER — LATANOPROST 0.005 % OP SOLN
1.0000 [drp] | Freq: Every day | OPHTHALMIC | Status: DC
  Administered 2018-05-15: 1 [drp] via OPHTHALMIC
  Filled 2018-05-15: qty 2.5

## 2018-05-15 MED ORDER — VANCOMYCIN HCL 1000 MG IV SOLR
INTRAVENOUS | Status: AC
  Filled 2018-05-15: qty 1000

## 2018-05-15 MED ORDER — LETROZOLE 2.5 MG PO TABS
2.5000 mg | ORAL_TABLET | Freq: Every day | ORAL | Status: DC
  Administered 2018-05-16: 2.5 mg via ORAL
  Filled 2018-05-15: qty 1

## 2018-05-15 MED ORDER — EZETIMIBE 10 MG PO TABS
10.0000 mg | ORAL_TABLET | Freq: Every day | ORAL | Status: DC
  Administered 2018-05-15: 10 mg via ORAL
  Filled 2018-05-15: qty 1

## 2018-05-15 SURGICAL SUPPLY — 54 items
BAG DECANTER FOR FLEXI CONT (MISCELLANEOUS) ×3 IMPLANT
CAPT HIP TOTAL 2 ×2 IMPLANT
CELLS DAT CNTRL 66122 CELL SVR (MISCELLANEOUS) IMPLANT
COVER SURGICAL LIGHT HANDLE (MISCELLANEOUS) ×3 IMPLANT
DRAPE C-ARM 42X72 X-RAY (DRAPES) ×3 IMPLANT
DRAPE POUCH INSTRU U-SHP 10X18 (DRAPES) ×3 IMPLANT
DRAPE STERI IOBAN 125X83 (DRAPES) ×3 IMPLANT
DRAPE U-SHAPE 47X51 STRL (DRAPES) ×6 IMPLANT
DRESSING AQUACEL AG SP 3.5X10 (GAUZE/BANDAGES/DRESSINGS) IMPLANT
DRSG AQUACEL AG ADV 3.5X10 (GAUZE/BANDAGES/DRESSINGS) ×3 IMPLANT
DRSG AQUACEL AG ADV 3.5X14 (GAUZE/BANDAGES/DRESSINGS) ×2 IMPLANT
DRSG AQUACEL AG SP 3.5X10 (GAUZE/BANDAGES/DRESSINGS) ×3
DURAPREP 26ML APPLICATOR (WOUND CARE) ×3 IMPLANT
ELECT BLADE 4.0 EZ CLEAN MEGAD (MISCELLANEOUS) ×3
ELECT REM PT RETURN 9FT ADLT (ELECTROSURGICAL) ×3
ELECTRODE BLDE 4.0 EZ CLN MEGD (MISCELLANEOUS) ×1 IMPLANT
ELECTRODE REM PT RTRN 9FT ADLT (ELECTROSURGICAL) ×1 IMPLANT
GLOVE BIOGEL PI IND STRL 7.0 (GLOVE) ×1 IMPLANT
GLOVE BIOGEL PI INDICATOR 7.0 (GLOVE) ×2
GLOVE ECLIPSE 7.0 STRL STRAW (GLOVE) ×6 IMPLANT
GLOVE SKINSENSE NS SZ7.5 (GLOVE) ×2
GLOVE SKINSENSE STRL SZ7.5 (GLOVE) ×1 IMPLANT
GLOVE SURG SYN 7.5  E (GLOVE) ×8
GLOVE SURG SYN 7.5 E (GLOVE) ×4 IMPLANT
GLOVE SURG SYN 7.5 PF PI (GLOVE) ×4 IMPLANT
GOWN SRG XL XLNG 56XLVL 4 (GOWN DISPOSABLE) ×1 IMPLANT
GOWN STRL NON-REIN XL XLG LVL4 (GOWN DISPOSABLE) ×3
GOWN STRL REUS W/ TWL LRG LVL3 (GOWN DISPOSABLE) IMPLANT
GOWN STRL REUS W/ TWL XL LVL3 (GOWN DISPOSABLE) ×1 IMPLANT
GOWN STRL REUS W/TWL LRG LVL3 (GOWN DISPOSABLE)
GOWN STRL REUS W/TWL XL LVL3 (GOWN DISPOSABLE) ×3
HANDPIECE INTERPULSE COAX TIP (DISPOSABLE) ×3
HOOD PEEL AWAY FLYTE STAYCOOL (MISCELLANEOUS) ×8 IMPLANT
IV NS IRRIG 3000ML ARTHROMATIC (IV SOLUTION) ×3 IMPLANT
KIT BASIN OR (CUSTOM PROCEDURE TRAY) ×3 IMPLANT
MARKER SKIN DUAL TIP RULER LAB (MISCELLANEOUS) ×3 IMPLANT
NDL SPNL 18GX3.5 QUINCKE PK (NEEDLE) ×1 IMPLANT
NEEDLE SPNL 18GX3.5 QUINCKE PK (NEEDLE) ×3 IMPLANT
PACK TOTAL JOINT (CUSTOM PROCEDURE TRAY) ×3 IMPLANT
PACK UNIVERSAL I (CUSTOM PROCEDURE TRAY) ×3 IMPLANT
RETRACTOR WND ALEXIS 18 MED (MISCELLANEOUS) IMPLANT
RTRCTR WOUND ALEXIS 18CM MED (MISCELLANEOUS)
SAW OSC TIP CART 19.5X105X1.3 (SAW) ×3 IMPLANT
SET HNDPC FAN SPRY TIP SCT (DISPOSABLE) ×1 IMPLANT
STAPLER VISISTAT 35W (STAPLE) IMPLANT
SUT ETHIBOND 2 V 37 (SUTURE) ×3 IMPLANT
SUT VIC AB 1 CT1 27 (SUTURE) ×3
SUT VIC AB 1 CT1 27XBRD ANBCTR (SUTURE) ×1 IMPLANT
SUT VIC AB 2-0 CT1 27 (SUTURE) ×6
SUT VIC AB 2-0 CT1 TAPERPNT 27 (SUTURE) ×2 IMPLANT
SYRINGE 60CC LL (MISCELLANEOUS) ×3 IMPLANT
TOWEL OR 17X26 10 PK STRL BLUE (TOWEL DISPOSABLE) ×3 IMPLANT
TRAY CATH 16FR W/PLASTIC CATH (SET/KITS/TRAYS/PACK) IMPLANT
YANKAUER SUCT BULB TIP NO VENT (SUCTIONS) ×3 IMPLANT

## 2018-05-15 NOTE — Anesthesia Procedure Notes (Signed)
Procedure Name: MAC Date/Time: 05/15/2018 7:45 AM Performed by: Renato Shin, CRNA Pre-anesthesia Checklist: Patient identified, Emergency Drugs available, Patient being monitored and Suction available Patient Re-evaluated:Patient Re-evaluated prior to induction Oxygen Delivery Method: Nasal cannula Preoxygenation: Pre-oxygenation with 100% oxygen Induction Type: IV induction Placement Confirmation: positive ETCO2,  CO2 detector and breath sounds checked- equal and bilateral Dental Injury: Teeth and Oropharynx as per pre-operative assessment

## 2018-05-15 NOTE — Transfer of Care (Signed)
Immediate Anesthesia Transfer of Care Note  Patient: [name]  Procedure(s) Performed: LEFT TOTAL HIP ARTHROPLASTY ANTERIOR APPROACH (Left Hip)  Patient Location: PACU  Anesthesia Type:MAC and Spinal  Level of Consciousness: awake, alert , oriented and patient cooperative  Airway & Oxygen Therapy: Patient Spontanous Breathing and Patient connected to nasal cannula oxygen  Post-op Assessment: Report given to RN and Post -op Vital signs reviewed and stable  Post vital signs: Reviewed and stable  Last Vitals:  Vitals Value Taken Time  BP 90/40 05/15/2018  9:41 AM  Temp    Pulse 74 05/15/2018  9:43 AM  Resp 23 05/15/2018  9:43 AM  SpO2 100 % 05/15/2018  9:43 AM  Vitals shown include unvalidated device data.  Last Pain:  Vitals:   05/15/18 0641  TempSrc: Oral  PainSc:       Patients Stated Pain Goal: 2 (78/58/85 0277)  Complications: No apparent anesthesia complications

## 2018-05-15 NOTE — Anesthesia Procedure Notes (Signed)
Spinal  Patient location during procedure: OR Start time: 05/15/2018 7:40 AM End time: 05/15/2018 7:44 AM Staffing Anesthesiologist: Barnet Glasgow, MD Preanesthetic Checklist Completed: patient identified, site marked, surgical consent, pre-op evaluation, timeout performed, risks and benefits discussed and monitors and equipment checked Spinal Block Patient position: sitting Prep: ChloraPrep Patient monitoring: heart rate, cardiac monitor, continuous pulse ox and blood pressure Approach: midline Location: L3-4 Injection technique: single-shot Needle Needle type: Pencan  Needle gauge: 25 G Needle length: 9 cm Needle insertion depth: 6 cm Assessment Sensory level: T4 Additional Notes Pt Tolerated Procedure well.  Spinal tray lot #[id number]

## 2018-05-15 NOTE — Anesthesia Postprocedure Evaluation (Signed)
Anesthesia Post Note  Patient: [name]  Procedure(s) Performed: LEFT TOTAL HIP ARTHROPLASTY ANTERIOR APPROACH (Left Hip)     Patient location during evaluation: PACU Anesthesia Type: Spinal Level of consciousness: oriented and awake and alert Pain management: pain level controlled Vital Signs Assessment: post-procedure vital signs reviewed and stable Respiratory status: spontaneous breathing, respiratory function stable and patient connected to nasal cannula oxygen Cardiovascular status: blood pressure returned to baseline and stable Postop Assessment: no headache, no backache and no apparent nausea or vomiting Anesthetic complications: no    Last Vitals:  Vitals:   05/15/18 1020 05/15/18 1044  BP:  124/62  Pulse: (!) 58 (!) 58  Resp: 15 16  Temp: (!) 36.1 C 36.6 C  SpO2: 99% 98%    Last Pain:  Vitals:   05/15/18 1116  TempSrc:   PainSc: 5                  Barnet Glasgow

## 2018-05-15 NOTE — H&P (Signed)
PREOPERATIVE H&P  Chief Complaint: left hip osteoarthritis  HPI: [name] is a 77 y.o. female who presents for surgical treatment of left hip osteoarthritis.  She denies any changes in medical history.  Past Medical History:  Diagnosis Date  . 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 External hemorrhoid, bleeding   . First degree heart block   . Glaucoma, both eyes   . Headache   . History of palpitations   . 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 Internal hemorrhoid, bleeding   . Personal history of radiation therapy 2016  . Primary localized osteoarthritis of left hip   . S/P radiation therapy 05/05/2015 through 06/05/2015   Right breast (high tangents) 4800 cGy in 24 sessions , no boost    . Wears glasses   . Wears hearing aid    bilateral   Past Surgical History:  Procedure Laterality Date  . ANTERIOR AND POSTERIOR REPAIR  06/10/2011   Procedure: ANTERIOR (CYSTOCELE) AND POSTERIOR REPAIR (RECTOCELE);  Surgeon: Blane Ohara Meisinger;  Location: [place] [facility];  Service: Gynecology;  Laterality: N/A;  . APPENDECTOMY  as child  . BREAST BIOPSY Right 02/19/2017  . BREAST LUMPECTOMY Right 02/2015  . BREAST SURGERY Right 03/19/15   lumpectomy/2 nodes  . CARPAL TUNNEL RELEASE Left   . CHOLECYSTECTOMY OPEN  1974  . COLONOSCOPY W/ BIOPSIES AND POLYPECTOMY    . EVALUATION UNDER ANESTHESIA WITH HEMORRHOIDECTOMY N/A 01/30/2016   Procedure: EXAM UNDER ANESTHESIA POSSIBLE HEMORRHOIDAL PEXY SINGLE COLUMN  HEMORRHOIDECTOMY;  Surgeon: [name], MD;  Location: [place];  Service: General;  Laterality: N/A;  . HAMMER TOE SURGERY Left   . KNEE ARTHROSCOPY Right   . ORIF RIGHT ANKLE FX  1999  .  RADIOACTIVE SEED GUIDED PARTIAL MASTECTOMY WITH AXILLARY SENTINEL LYMPH NODE BIOPSY Right 03/19/2015   Procedure: RIGHT BREAST LUMPECTOMY WITH RADIOACTIVE SEED AND SENTINEL LYMPH NODE MAPPING;  Surgeon: Autumn Messing III, MD;  Location: [place];  Service: General;  Laterality: Right;  . TONSILLECTOMY  as child  . TOTAL KNEE ARTHROPLASTY Right 05/23/2013   Procedure: Right Total Knee Arthroplasty;  Surgeon: Newt Minion, MD;  [place];  Service: Orthopedics;  Laterality: Right;  Right Total Knee Arthroplasty  . TUBAL LIGATION    . VAGINAL HYSTERECTOMY  07-07-2010   w/  Anterior & Posterior Repair and Solyx mid-urethral sling  . VAGINAL PROLAPSE REPAIR  06/10/2011   Procedure: VAGINAL VAULT SUSPENSION;  Surgeon: Blane Ohara Meisinger;  Location: [place] [facility];  Service: Gynecology;  Laterality: N/A;   Social History   Socioeconomic History  . Marital status: Married    Spouse name: Not on file  . Number of children: Not on file  . Years of education: Not on file  . Highest education level: Not on file  Occupational History  . Not on file  Social Needs  . Financial resource strain: Not on file  . Food insecurity:    Worry: Not on file    Inability: Not on file  . Transportation needs:    Medical: Not on file    Non-medical: Not on file  Tobacco Use  . Smoking status: Never Smoker  . Smokeless tobacco: Never Used  Substance and Sexual Activity  . Alcohol use: No  . Drug use: No  . Sexual activity: Not on file  Lifestyle  . Physical activity:    Days per week: Not on file    Minutes per session: Not on file  . Stress: Not on file  Relationships  . Social connections:    Talks on phone: Not on file    Gets together: Not on file    Attends religious service: Not on file    Active member of club or organization: Not on file    Attends meetings of clubs or organizations: Not on file    Relationship status: Not on file  Other Topics Concern  . Not on file  Social  History Narrative  . Not on file   Family History  Adopted: Yes   Allergies  Allergen Reactions  . Statins Other (See Comments)    MUSCLE ACHES-- LIPITOR, PRAVASTATIN, WELCHOL   Prior to Admission medications   Medication Sig Start Date End Date Taking? Authorizing Provider  aspirin EC 81 MG tablet Take 1 tablet (81 mg total) by mouth daily. Patient taking differently: Take 81 mg by mouth daily with supper.  05/25/13  Yes Newt Minion, MD  bimatoprost (LUMIGAN) 0.01 % SOLN Place 1 drop into both eyes at bedtime.    Yes [provider]  Calcium-Vitamin D (CALTRATE 600 PLUS-VIT D PO) Take 2 tablets by mouth daily with supper.    Yes [provider]  dorzolamide-timolol (COSOPT) 22.3-6.8 MG/ML ophthalmic solution Place 1 drop into both eyes 2 (two) times daily.  02/26/15  Yes [provider]  ezetimibe (ZETIA) 10 MG tablet Take 10 mg by mouth daily with supper.    Yes [provider]  letrozole (FEMARA) 2.5 MG tablet Take 1 tablet (2.5 mg total) by mouth daily. 11/03/17  Yes Nicholas Lose, MD  levothyroxine (SYNTHROID, LEVOTHROID) 100 MCG tablet Take 100 mcg by mouth daily before breakfast.   Yes [provider]  meloxicam (MOBIC) 15 MG tablet Take 1 tablet (15 mg total) by mouth daily. Patient taking differently: Take 15 mg by mouth daily as needed for pain.  11/03/17  Yes Nicholas Lose, MD  multivitamin [facility]) per tablet Take 1 tablet by mouth daily.    Yes [provider]  Omega-3 Fatty Acids (FISH OIL) 1000 MG CAPS Take 2,000 capsules by mouth 2 (two) times daily.   Yes [provider]  polyethylene glycol (MIRALAX / GLYCOLAX) packet Take 17 g by mouth daily as needed for mild constipation.    Yes [provider]     Positive ROS: All other systems have been reviewed and were otherwise negative with the exception of those mentioned in the HPI and as above.  Physical Exam: General: Alert, no acute  distress Cardiovascular: No pedal edema Respiratory: No cyanosis, no use of accessory musculature GI: abdomen soft Skin: No lesions in the area of chief complaint Neurologic: Sensation intact distally Psychiatric: Patient is competent for consent with normal mood and affect Lymphatic: no lymphedema  MUSCULOSKELETAL: exam stable  Assessment: left hip osteoarthritis  Plan: Plan for Procedure(s): LEFT TOTAL HIP ARTHROPLASTY ANTERIOR APPROACH  The risks benefits and alternatives were discussed with the patient including but not limited to the risks of nonoperative treatment, versus surgical intervention including infection, bleeding, nerve injury,  blood clots, cardiopulmonary complications, morbidity, mortality, among others, and they were willing to proceed.   Preoperative templating of the joint replacement has been completed, documented,  and submitted to the Operating Room personnel in order to optimize intra-operative equipment management.  Eduard Roux, MD   05/15/2018 7:30 AM

## 2018-05-15 NOTE — Evaluation (Signed)
Physical Therapy Evaluation Patient Details Name: [name] MRN: [id number] DOB: 16-Dec-1940 Today's Date: 05/15/2018   History of Present Illness  [name] is a 77 y.o. F s/p L THR. PMH includes breast Cx and masectomy, 1st degree heart block, galucoma, bilateral hearing aids, vaginal prolapse, Hypothyroidism, Arthritis and a R TKR in 2014.   Clinical Impression   Pt presents with problems above and deficits below. Pt was educated on precautions and HEP. Pt performed HEP and performed sit<>stand transfers with MinG-MinA. Pt ambulated to the bathroom with a RW with MinG-MinA. Will continue to follow acutely to support mobility, independence, and safety.     Follow Up Recommendations Follow surgeon's recommendation for DC plan and follow-up therapies    Equipment Recommendations  3in1 (PT)    Recommendations for Other Services       Precautions / Restrictions Precautions Precautions: Fall Precaution Comments: Pt educated on precautions and in-bed HEP. Restrictions Weight Bearing Restrictions: Yes LLE Weight Bearing: Weight bearing as tolerated      Mobility  Bed Mobility Overal bed mobility: Needs Assistance Bed Mobility: Supine to Sit     Supine to sit: Supervision     General bed mobility comments: Pt needed increased time to sit up at EOB, and cues to scoot hips to EOB, but required no hands on assistance. Pt reported some dizziness with sitting up, however improved with time.   Transfers Overall transfer level: Needs assistance Equipment used: Rolling walker (2 wheeled) Transfers: Sit to/from Stand Sit to Stand: Min guard;Min assist         General transfer comment: Pt responded well to safety cues using RW, and needed MinA-MinG for intial power up into standing. Pt reported no increased dizziness with standing.    Ambulation/Gait Ambulation/Gait assistance: Min guard Gait Distance (Feet): 20 Feet(10x2 to the bathroom and back) Assistive device:  Rolling walker (2 wheeled) Gait Pattern/deviations: Antalgic;Step-to pattern;Decreased weight shift to left;Decreased stance time - left;Decreased step length - left;Decreased step length - right;Narrow base of support Gait velocity: Decreased   General Gait Details: Pt ambulated well with RW, and kept it close during ambulation. Pt had a tendency to lean forward, and had decreased weight shift on to L leg and reported feeling "Pressure" in L Hip. Pt reported feeling one "catch" in L hip during ambulation. Pt moved slowly, but had no visible LOB and did not appear unsteady.   Stairs            Wheelchair Mobility    Modified Rankin (Stroke Patients Only)       Balance Overall balance assessment: Needs assistance Sitting-balance support: No upper extremity supported;Feet supported Sitting balance-Leahy Scale: Fair Sitting balance - Comments: Pt was able to sit with arms raised while PT put on gait belt   Standing balance support: No upper extremity supported;Bilateral upper extremity supported Standing balance-Leahy Scale: Fair Standing balance comment: Pt was able to wash hands at the sink with no visible LOB, and no apparent unsteadiness. Pt was able to multi-task while performing standing without UE support.                              Pertinent Vitals/Pain Pain Assessment: No/denies pain    Home Living Family/patient expects to be discharged to:: Private residence Living Arrangements: Spouse/significant other Available Help at Discharge: Family;Available 24 hours/day Type of Home: House Home Access: Stairs to enter;Ramped entrance Entrance Stairs-Rails: Can reach both Entrance Stairs-Number  of Steps: 7 Home Layout: Two level;Able to live on main level with bedroom/bathroom Home Equipment: Kasandra Knudsen - quad;Walker - 2 wheels;Grab bars - tub/shower;Tub bench      Prior Function Level of Independence: Independent;Independent with assistive device(s)          Comments: Would occasionally use cane for ambulation, but often independent at home.     Hand Dominance   Dominant Hand: Right    Extremity/Trunk Assessment   Upper Extremity Assessment Upper Extremity Assessment: Generalized weakness    Lower Extremity Assessment Lower Extremity Assessment: LLE deficits/detail LLE Deficits / Details: Deficits as expected s/p direct anterior L THR    Cervical / Trunk Assessment Cervical / Trunk Assessment: Kyphotic  Communication   Communication: HOH(Wears hearing aids)  Cognition Arousal/Alertness: Awake/alert Behavior During Therapy: WFL for tasks assessed/performed Overall Cognitive Status: Within Functional Limits for tasks assessed                                 General Comments: Pt seemed a little low energy, but answered questions, monitored the environment.      General Comments General comments (skin integrity, edema, etc.): Pt reports difficulty urinating, however, was able to void.    Exercises Total Joint Exercises Ankle Circles/Pumps: AROM;Left;20 reps;Supine Quad Sets: AROM;Left;10 reps;Supine Heel Slides: AROM;Left;10 reps;Supine   Assessment/Plan    PT Assessment Patient needs continued PT services  PT Problem List Decreased strength;Decreased range of motion;Decreased balance;Decreased mobility;Decreased knowledge of use of DME;Decreased knowledge of precautions       PT Treatment Interventions Gait training;Stair training;Functional mobility training;Therapeutic activities;Therapeutic exercise;Balance training;Patient/family education    PT Goals (Current goals can be found in the Care Plan section)  Acute Rehab PT Goals Patient Stated Goal: To go home PT Goal Formulation: With patient Time For Goal Achievement: 05/29/18 Potential to Achieve Goals: Good    Frequency 7X/week   Barriers to discharge        Co-evaluation               AM-PAC PT "6 Clicks" Daily Activity  Outcome  Measure Difficulty turning over in bed (including adjusting bedclothes, sheets and blankets)?: A Little Difficulty moving from lying on back to sitting on the side of the bed? : A Little Difficulty sitting down on and standing up from a chair with arms (e.g., wheelchair, bedside commode, etc,.)?: Unable Help needed moving to and from a bed to chair (including a wheelchair)?: A Little Help needed walking in hospital room?: A Little Help needed climbing 3-5 steps with a railing? : A Lot 6 Click Score: 15    End of Session Equipment Utilized During Treatment: Gait belt Activity Tolerance: Patient tolerated treatment well Patient left: in chair;with call bell/phone within reach;with chair alarm set;with family/visitor present Nurse Communication: Mobility status PT Visit Diagnosis: Other abnormalities of gait and mobility (R26.89);Muscle weakness (generalized) (M62.81);Difficulty in walking, not elsewhere classified (R26.2)    Time: 6553-7482 PT Time Calculation (min) (ACUTE ONLY): 31 min   Charges:   PT Evaluation $PT Eval Low Complexity: 1 Low PT Treatments $Therapeutic Activity: 8-22 mins   PT G Codes:       Elwin Mocha, S-DPT Acute Care Rehab Student [phone]   05/15/2018, 6:28 PM

## 2018-05-15 NOTE — Discharge Instructions (Signed)

## 2018-05-15 NOTE — Op Note (Signed)
LEFT TOTAL HIP ARTHROPLASTY ANTERIOR APPROACH  Procedure Note [name]   [id number]  Pre-op Diagnosis: left hip osteoarthritis     Post-op Diagnosis: same   Operative Procedures  1. Total hip replacement; Left hip; uncemented [id number]   Personnel  Surgeon(s): Leandrew Koyanagi, MD  Assist: Madalyn Rob, PA-C; necessary for the timely completion of procedure and due to complexity of procedure.   Anesthesia: spinal  Prosthesis: Depuy Acetabulum: Pinnacle 56 mm Femur: Corail KA 11 Head: 36 mm size: +1.5 Liner: +0 neutral Bearing Type: metal on poly  Total Hip Arthroplasty (Anterior Approach) Op Note:  After informed consent was obtained and the operative extremity marked in the holding area, the patient was brought back to the operating room and placed supine on the HANA table. Next, the operative extremity was prepped and draped in normal sterile fashion. Surgical timeout occurred verifying patient identification, surgical site, surgical procedure and administration of antibiotics.  A modified anterior Smith-Peterson approach to the hip was performed, using the interval between tensor fascia lata and sartorius.  Dissection was carried bluntly down onto the anterior hip capsule. The lateral femoral circumflex vessels were identified and coagulated. A capsulotomy was performed and the capsular flaps tagged for later repair.  Fluoroscopy was utilized to prepare for the femoral neck cut. The neck osteotomy was performed. The femoral head was removed, the acetabular rim was cleared of soft tissue and attention was turned to reaming the acetabulum.  Sequential reaming was performed under fluoroscopic guidance. We reamed to a size 55 mm, and then impacted the acetabular shell. The liner was then placed after irrigation and attention turned to the femur.  After placing the femoral hook, the leg was taken to externally rotated, extended and adducted position taking care to perform  soft tissue releases to allow for adequate mobilization of the femur. Soft tissue was cleared from the shoulder of the greater trochanter and the hook elevator used to improve exposure of the proximal femur. Sequential broaching performed up to a size 11. Trial neck and head were placed. The leg was brought back up to neutral and the construct reduced. The position and sizing of components, offset and leg lengths were checked using fluoroscopy. Stability of the  construct was checked in extension and external rotation without any subluxation or impingement of prosthesis. We dislocated the prosthesis, dropped the leg back into position, removed trial components, and irrigated copiously. The final stem and head was then placed, the leg brought back up, the system reduced and fluoroscopy used to verify positioning.  We irrigated, obtained hemostasis and closed the capsule using #2 ethibond suture.  One gram of vancomycin powder was placed in the surgical bed. The fascia was closed with #1 vicryl plus, the deep fat layer was closed with 0 vicryl, the subcutaneous layers closed with 2.0 Vicryl Plus and the skin closed with 3.0 monocryl and steri strips. A sterile dressing was applied. The patient was awakened in the operating room and taken to recovery in stable condition.  All sponge, needle, and instrument counts were correct at the end of the case.   Position: supine  Complications: none.  Time Out: performed   Drains/Packing: none  Estimated blood loss: 150 cc  Returned to Recovery Room: in good condition.   Antibiotics: yes   Mechanical VTE (DVT) Prophylaxis: sequential compression devices, TED thigh-high  Chemical VTE (DVT) Prophylaxis: aspirin   Fluid Replacement: see anesthesia record  Specimens Removed: 1 to pathology   Sponge  and Instrument Count Correct? yes   PACU: portable radiograph - low AP   Admission: inpatient status  Plan/RTC: Return in 2 weeks for staple removal. Weight  Bearing/Load Lower Extremity: full  Hip precautions: none Suture Removal: 10-14 days  Betadine to incision twice daily once dressing is removed on POD#7  N. Eduard Roux, MD [place] 9:12 AM     Implant Name Type Inv. Item Serial No. Manufacturer Lot No. LRB No. Used  LINER NEUTRAL [id number] - [id number] Liner LINER NEUTRAL [id number]  DEPUY SYNTHES [id number] Left 1  PINN SECTOR W/GRIP ACE CUP 56 - [id number] Hips PINN SECTOR W/GRIP ACE CUP 56  DEPUY SYNTHES [id number] Left 1  STEM CORAIL KA11 - [id number] Stem STEM CORAIL KA11  DEPUY SYNTHES [id number] Left 1  SROM M HEAD 36MM PLUS 1.5 - [id number] Hips SROM M HEAD 36MM PLUS 1.5  DEPUY SYNTHES [id number] Left 1

## 2018-05-15 NOTE — Plan of Care (Signed)
  Problem: Coping: ?Goal: Level of anxiety will decrease ?Outcome: Progressing ?  ?Problem: Safety: ?Goal: Ability to remain free from injury will improve ?Outcome: Progressing ?  ?Problem: Pain Management: ?Goal: Pain level will decrease with appropriate interventions ?Outcome: Progressing ?  ?

## 2018-05-16 ENCOUNTER — Encounter (HOSPITAL_COMMUNITY): Payer: Self-pay | Admitting: Orthopaedic Surgery

## 2018-05-16 LAB — CBC
HCT: 30 % — ABNORMAL LOW (ref 36.0–46.0)
Hemoglobin: 10.1 g/dL — ABNORMAL LOW (ref 12.0–15.0)
MCH: 32.6 pg (ref 26.0–34.0)
MCHC: 33.7 g/dL (ref 30.0–36.0)
MCV: 96.8 fL (ref 78.0–100.0)
PLATELETS: 163 10*3/uL (ref 150–400)
RBC: 3.1 MIL/uL — AB (ref 3.87–5.11)
RDW: 12.4 % (ref 11.5–15.5)
WBC: 10.7 10*3/uL — AB (ref 4.0–10.5)

## 2018-05-16 LAB — BASIC METABOLIC PANEL
ANION GAP: 8 (ref 5–15)
BUN: 13 mg/dL (ref 8–23)
CO2: 23 mmol/L (ref 22–32)
Calcium: 8.3 mg/dL — ABNORMAL LOW (ref 8.9–10.3)
Chloride: 107 mmol/L (ref 98–111)
Creatinine, Ser: 0.72 mg/dL (ref 0.44–1.00)
GFR calc Af Amer: >60 mL/min (ref >60)
Glucose, Bld: 161 mg/dL — ABNORMAL HIGH (ref 70–99)
Potassium: 3.5 mmol/L (ref 3.5–5.1)
Sodium: 138 mmol/L (ref 135–145)

## 2018-05-16 NOTE — Care Management Note (Signed)
Case Management Note  Patient Details  Name: [name] MRN: [id number] Date of Birth: 08-Dec-1940  Subjective/Objective:  77 yr old female s/p left total hip arthroplasty.                  Action/Plan: Patient was preoperatively setup with [facility], no changes. Will have family support at discharge.    Expected Discharge Date:  05/16/18               Expected Discharge Plan:  [place]  In-House Referral:  NA  Discharge planning Services  CM Consult  Post Acute Care Choice:  Durable Medical Equipment, Home Health Choice offered to:  Patient  DME Arranged:  3-N-1, Walker rolling DME Agency:  [place]:  PT [place] Agency:  [facility] (formerly [facility])  Status of Service:  Completed, signed off  If discussed at [employer], dates discussed:    Additional Comments:  Ninfa Meeker, RN 05/16/2018, 11:47 AM

## 2018-05-16 NOTE — Progress Notes (Signed)
Subjective: 1 Day Post-Op Procedure(s) (LRB): LEFT TOTAL HIP ARTHROPLASTY ANTERIOR APPROACH (Left) Patient reports pain as mild.  Doing well this am.  Ready to go home.  Objective: Vital signs in last 24 hours: Temp:  [97 F (36.1 C)-97.9 F (36.6 C)] 97.8 F (36.6 C) (07/23 0451) Pulse Rate:  [58-78] 68 (07/23 0451) Resp:  [10-20] 18 (07/23 0451) BP: (90-124)/(40-62) 117/56 (07/23 0451) SpO2:  [96 %-100 %] 96 % (07/23 0451)  Intake/Output from previous day: 07/22 0701 - 07/23 0700 In: 1140 [P.O.:240; I.V.:900] Out: 500 [Blood:500] Intake/Output this shift: No intake/output data recorded.  Recent Labs    05/16/18 0502  HGB 10.1*   Recent Labs    05/16/18 0502  WBC 10.7*  RBC 3.10*  HCT 30.0*  PLT 163   Recent Labs    05/16/18 0502  NA 138  K 3.5  CL 107  CO2 23  BUN 13  CREATININE 0.72  GLUCOSE 161*  CALCIUM 8.3*   No results for input(s): LABPT, INR in the last 72 hours.  Neurologically intact Neurovascular intact Sensation intact distally Intact pulses distally Dorsiflexion/Plantar flexion intact Incision: dressing C/D/I No cellulitis present Compartment soft    Assessment/Plan: 1 Day Post-Op Procedure(s) (LRB): LEFT TOTAL HIP ARTHROPLASTY ANTERIOR APPROACH (Left) Advance diet Up with therapy D/C IV fluids Discharge home with home health today as long as she does well with stair training with PT WBAT LLE- no precautions ABLA- mild and stable    [name] 05/16/2018, 7:34 AM

## 2018-05-16 NOTE — Progress Notes (Signed)
Discharge instructions (including medications) discussed with and copy provided to patient/caregiver. All belongings, including DME, sent with patient.

## 2018-05-16 NOTE — Progress Notes (Signed)
Physical Therapy Treatment Patient Details Name: [name] MRN: [id number] DOB: 08-Mar-1941 Today's Date: 05/16/2018    History of Present Illness [name] is a 77 y.o. F s/p L THR direct anterior approach with no hip precautions. PMH includes breast Cx and masectomy, 1st degree heart block, galucoma, bilateral hearing aids, vaginal prolapse, Hypothyroidism, Arthritis and a R TKR in 2014.     PT Comments    Pt performed gait and stair training in prep for d/c home.  In room HEP reviewed for accuracy and patient completed x5 reps of each exercise.  Educated patient on frequency and technique.  Pt reports she has a BSC at home but will need a RW.  Informed nursing and case management of patient needs.  Pt is ready to d/c home from a mobility stand point.    Follow Up Recommendations  Follow surgeon's recommendation for DC plan and follow-up therapies     Equipment Recommendations  Rolling walker with 5" wheels    Recommendations for Other Services       Precautions / Restrictions Precautions Precautions: Fall Precaution Comments: Pt educated on precautions and in-bed HEP. Restrictions Weight Bearing Restrictions: Yes LLE Weight Bearing: Weight bearing as tolerated    Mobility  Bed Mobility               General bed mobility comments: Pt in recliner on arrival  Transfers Overall transfer level: Needs assistance Equipment used: Rolling walker (2 wheeled) Transfers: Sit to/from Stand Sit to Stand: Supervision         General transfer comment: Cues for hand placement, pt slow to ascend but required no physical assistance.    Ambulation/Gait Ambulation/Gait assistance: Min guard Gait Distance (Feet): 180 Feet Assistive device: Rolling walker (2 wheeled) Gait Pattern/deviations: Antalgic;Decreased weight shift to left;Decreased stance time - left;Narrow base of support;Step-through pattern;Trunk flexed Gait velocity: Decreased   General Gait Details:  Cues for upper trunk control, increasing stride length and RW safety.  Gait continues to improve.     Stairs Stairs: Yes Stairs assistance: Supervision Stair Management: Two rails;Forwards;Step to pattern Number of Stairs: 7 General stair comments: Cues for sequencing and hand placement on railing, spouse present and instructed to manage bring RW to the top and bottom of the stairs for the patient to use when stair negotiationj is complete at home.     Wheelchair Mobility    Modified Rankin (Stroke Patients Only)       Balance Overall balance assessment: Needs assistance   Sitting balance-Leahy Scale: Fair       Standing balance-Leahy Scale: Fair                              Cognition Arousal/Alertness: Awake/alert Behavior During Therapy: WFL for tasks assessed/performed Overall Cognitive Status: Within Functional Limits for tasks assessed                                        Exercises Total Joint Exercises Ankle Circles/Pumps: AROM;Left;Supine;5 reps Quad Sets: AROM;Left;Supine;5 reps Short Arc Quad: AROM;Left;Supine;5 reps Heel Slides: AROM;Left;Supine;5 reps Hip ABduction/ADduction: AROM;Left;5 reps;Supine Long Arc Quad: AROM;Left;5 reps;Seated Knee Flexion: AROM;Left;5 reps;Standing Marching in Standing: AROM;Left;5 reps;Standing Standing Hip Extension: AROM;Left;5 reps;Standing General Exercises - Lower Extremity Hip ABduction/ADduction: AROM;Left;5 reps;Supine    General Comments        Pertinent Vitals/Pain Pain  Assessment: No/denies pain    Home Living                      Prior Function            PT Goals (current goals can now be found in the care plan section) Acute Rehab PT Goals Patient Stated Goal: To go home Potential to Achieve Goals: Good Progress towards PT goals: Progressing toward goals    Frequency    7X/week      PT Plan Current plan remains appropriate    Co-evaluation               AM-PAC PT "6 Clicks" Daily Activity  Outcome Measure  Difficulty turning over in bed (including adjusting bedclothes, sheets and blankets)?: A Little Difficulty moving from lying on back to sitting on the side of the bed? : A Little Difficulty sitting down on and standing up from a chair with arms (e.g., wheelchair, bedside commode, etc,.)?: Unable Help needed moving to and from a bed to chair (including a wheelchair)?: A Little Help needed walking in hospital room?: A Little Help needed climbing 3-5 steps with a railing? : A Lot 6 Click Score: 15    End of Session Equipment Utilized During Treatment: Gait belt Activity Tolerance: Patient tolerated treatment well Patient left: in chair;with call bell/phone within reach;with chair alarm set;with family/visitor present Nurse Communication: Mobility status PT Visit Diagnosis: Other abnormalities of gait and mobility (R26.89);Muscle weakness (generalized) (M62.81);Difficulty in walking, not elsewhere classified (R26.2)     Time: 6720-9470 PT Time Calculation (min) (ACUTE ONLY): 16 min  Charges:  $Gait Training: 8-22 mins                    G Codes:       Governor Rooks, PTA pager [phone]    Cristela Blue 05/16/2018, 10:17 AM

## 2018-05-16 NOTE — Discharge Summary (Signed)
Patient ID: [name] MRN: [id number] DOB/AGE: 25-Jul-1941 77 y.o.  Admit date: 05/15/2018 Discharge date: 05/16/2018  Admission Diagnoses:  Active Problems:   Primary osteoarthritis of left hip   History of hip replacement   Discharge Diagnoses:  Same  Past Medical History:  Diagnosis Date  . 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 External hemorrhoid, bleeding   . First degree heart block   . Glaucoma, both eyes   . Headache   . History of palpitations   . 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 Internal hemorrhoid, bleeding   . Personal history of radiation therapy 2016  . Primary localized osteoarthritis of left hip   . S/P radiation therapy 05/05/2015 through 06/05/2015   Right breast (high tangents) 4800 cGy in 24 sessions , no boost    . Wears glasses   . Wears hearing aid    bilateral    Surgeries: Procedure(s): LEFT TOTAL HIP ARTHROPLASTY ANTERIOR APPROACH on 05/15/2018   Consultants:   Discharged Condition: Improved  Hospital Course: AVEYAH GREENWOOD is an 77 y.o. female who was admitted 05/15/2018 for operative treatment of primary localized osteoarthritis left hip. Patient has severe unremitting pain that affects sleep, daily activities, and work/hobbies. After pre-op clearance the patient was taken to the operating room on 05/15/2018 and underwent  Procedure(s): LEFT TOTAL HIP ARTHROPLASTY ANTERIOR APPROACH.    Patient was given perioperative antibiotics:  Anti-infectives (From admission, onward)   Start     Dose/Rate Route Frequency Ordered Stop   05/15/18 0945  ceFAZolin (ANCEF) IVPB 2g/100 mL premix     2 g 200 mL/hr over 30 Minutes Intravenous Every 6 hours 05/15/18 0935 05/15/18  2146   05/15/18 0846  vancomycin (VANCOCIN) 1,000 mg in sodium chloride 0.9 % 250 mL IVPB     over 60 Minutes  Continuous PRN 05/15/18 0846 05/15/18 0846   05/15/18 0600  ceFAZolin (ANCEF) IVPB 2g/100 mL premix     2 g 200 mL/hr over 30 Minutes Intravenous On call to O.R. 05/15/18 1517 05/15/18 0817       Patient was given sequential compression devices, early ambulation, and chemoprophylaxis to prevent DVT.  Patient benefited maximally from hospital stay and there were no complications.    Recent vital signs:  Patient Vitals for the past 24 hrs:  BP Temp Temp src Pulse Resp SpO2  05/16/18 0451 (!) 117/56 97.8 F (36.6 C) Oral 68 18 96 %  05/16/18 0010 (!) 105/54 97.7 F (36.5 C) Oral 64 16 97 %  05/15/18 2000 (!) 117/54 97.6 F (36.4 C) Oral 72 16 -  05/15/18 1741 (!) 121/55 97.9 F (36.6 C) Oral 76 20 97 %  05/15/18 1044 124/62 97.9 F (36.6 C) Oral (!) 58 16 98 %  05/15/18 1020 - (!) 97 F (36.1 C) - (!) 58 15 99 %  05/15/18 1011 (!) 109/55 - - 61 14 100 %  05/15/18 1005 - - - 64 19 100 %  05/15/18 0956 (!) 97/51 - - 66 14 100 %  05/15/18 0941 (!) 90/40 (!) 97.2 F (36.2 C) - 78 10 98 %     Recent laboratory studies:  Recent Labs    05/16/18 0502  WBC 10.7*  HGB  10.1*  HCT 30.0*  PLT 163  NA 138  K 3.5  CL 107  CO2 23  BUN 13  CREATININE 0.72  GLUCOSE 161*  CALCIUM 8.3*     Discharge Medications:   Allergies as of 05/16/2018      Reactions   Statins Other (See Comments)   MUSCLE ACHES-- LIPITOR, PRAVASTATIN, WELCHOL      Medication List    STOP taking these medications   Fish Oil 1000 MG Caps   meloxicam 15 MG tablet Commonly known as:  MOBIC     TAKE these medications   aspirin EC 81 MG tablet Take 1 tablet (81 mg total) by mouth 2 (two) times daily. What changed:  when to take this   bimatoprost 0.01 % Soln Commonly known as:  LUMIGAN Place 1 drop into both eyes at bedtime.   CALTRATE 600 PLUS-VIT D PO Take 2 tablets by mouth daily  with supper.   dorzolamide-timolol 22.3-6.8 MG/ML ophthalmic solution Commonly known as:  COSOPT Place 1 drop into both eyes 2 (two) times daily.   ezetimibe 10 MG tablet Commonly known as:  ZETIA Take 10 mg by mouth daily with supper.   letrozole 2.5 MG tablet Commonly known as:  FEMARA Take 1 tablet (2.5 mg total) by mouth daily.   levothyroxine 100 MCG tablet Commonly known as:  SYNTHROID, LEVOTHROID Take 100 mcg by mouth daily before breakfast.   methocarbamol 500 MG tablet Commonly known as:  ROBAXIN Take 1 tablet (500 mg total) by mouth every 6 (six) hours as needed for muscle spasms.   multivitamin per tablet Take 1 tablet by mouth daily.   ondansetron 4 MG tablet Commonly known as:  ZOFRAN Take 1-2 tablets (4-8 mg total) by mouth every 8 (eight) hours as needed for nausea or vomiting.   oxyCODONE 5 MG immediate release tablet Commonly known as:  Oxy IR/ROXICODONE Take 1-3 tablets (5-15 mg total) by mouth every 4 (four) hours as needed.   oxyCODONE 10 mg 12 hr tablet Commonly known as:  OXYCONTIN Take 1 tablet (10 mg total) by mouth every 12 (twelve) hours for 3 days.   polyethylene glycol packet Commonly known as:  MIRALAX / GLYCOLAX Take 17 g by mouth daily as needed for mild constipation.   promethazine 25 MG tablet Commonly known as:  PHENERGAN Take 1 tablet (25 mg total) by mouth every 6 (six) hours as needed for nausea.   senna-docusate 8.6-50 MG tablet Commonly known as:  SENOKOT S Take 1 tablet by mouth at bedtime as needed.            Durable Medical Equipment  (From admission, onward)        Start     Ordered   05/15/18 1053  DME Walker rolling  Once    Question:  Patient needs a walker to treat with the following condition  Answer:  History of hip replacement   05/15/18 1052   05/15/18 1053  DME 3 n 1  Once     05/15/18 1052   05/15/18 1053  DME Bedside commode  Once    Question:  Patient needs a bedside commode to treat with the  following condition  Answer:  History of hip replacement   05/15/18 1052      Diagnostic Studies: Dg Chest 2 View  Result Date: 05/04/2018 CLINICAL DATA:  Pre-op exam hip replacement. Hx of breast cancer, palpitations. Nonsmoker. EXAM: CHEST - 2 VIEW COMPARISON:  Chest x-ray dated 05/16/2013. FINDINGS:  Heart size and mediastinal contours are stable. Coarse lung markings noted bilaterally suggesting chronic interstitial lung disease/fibrosis. No confluent opacity to suggest a developing pneumonia. No pleural effusion, pulmonary edema or pneumothorax. No acute or suspicious osseous finding. IMPRESSION: 1. No active cardiopulmonary disease. No evidence of pneumonia or pulmonary edema. 2. Probable chronic interstitial lung disease. Electronically Signed   By: Franki Cabot M.D.   On: 05/04/2018 13:10   Dg Pelvis Portable  Result Date: 05/15/2018 CLINICAL DATA:  77 year old female status post left hip replacement. EXAM: PORTABLE PELVIS 1-2 VIEWS COMPARISON:  Intraoperative images 0 757 hours today. CT Abdomen and Pelvis 05/19/2004. FINDINGS: Portable AP view at 0947 hours. Bipolar type left hip arthroplasty hardware in place and normally aligned in the AP view. Hardware appears intact. Surrounding soft tissue postoperative changes including soft tissue gas. No unexpected osseous changes identified. Negative visible bowel gas pattern. IMPRESSION: Left bipolar hip arthroplasty with no adverse features. Electronically Signed   By: Genevie Ann M.D.   On: 05/15/2018 10:08   Dg C-arm 1-60 Min  Result Date: 05/15/2018 CLINICAL DATA:  Left hip arthroplasty EXAM: OPERATIVE LEFT HIP WITH PELVIS; DG C-ARM 61-120 MIN COMPARISON:  None. FLUOROSCOPY TIME:  Fluoroscopy Time:  29 seconds Radiation Exposure Index (if provided by the fluoroscopic device): Not available Number of Acquired Spot Images: 5 FINDINGS: Initial images demonstrates significant degenerative change of the left femoral head and hip joint. Subsequent images  demonstrate removal of the proximal left femur with placement of a prosthesis in satisfactory position. No acute bony abnormality is noted. IMPRESSION: Status post left hip replacement. Electronically Signed   By: Inez Catalina M.D.   On: 05/15/2018 09:27   Dg C-arm 1-60 Min  Result Date: 05/15/2018 CLINICAL DATA:  Left hip arthroplasty EXAM: OPERATIVE LEFT HIP WITH PELVIS; DG C-ARM 61-120 MIN COMPARISON:  None. FLUOROSCOPY TIME:  Fluoroscopy Time:  29 seconds Radiation Exposure Index (if provided by the fluoroscopic device): Not available Number of Acquired Spot Images: 5 FINDINGS: Initial images demonstrates significant degenerative change of the left femoral head and hip joint. Subsequent images demonstrate removal of the proximal left femur with placement of a prosthesis in satisfactory position. No acute bony abnormality is noted. IMPRESSION: Status post left hip replacement. Electronically Signed   By: Inez Catalina M.D.   On: 05/15/2018 09:27   Dg Hip Operative Unilat W Or W/o Pelvis Left  Result Date: 05/15/2018 CLINICAL DATA:  Left hip arthroplasty EXAM: OPERATIVE LEFT HIP WITH PELVIS; DG C-ARM 61-120 MIN COMPARISON:  None. FLUOROSCOPY TIME:  Fluoroscopy Time:  29 seconds Radiation Exposure Index (if provided by the fluoroscopic device): Not available Number of Acquired Spot Images: 5 FINDINGS: Initial images demonstrates significant degenerative change of the left femoral head and hip joint. Subsequent images demonstrate removal of the proximal left femur with placement of a prosthesis in satisfactory position. No acute bony abnormality is noted. IMPRESSION: Status post left hip replacement. Electronically Signed   By: Inez Catalina M.D.   On: 05/15/2018 09:27    Disposition: Discharge disposition: 01-Home or Self Care         Follow-up Information    Leandrew Koyanagi, MD In 2 weeks.   Specialty:  Orthopedic Surgery Why:  For suture removal, For wound re-check Contact information: [place] [address] [phone]            Signed: Aundra Dubin 05/16/2018, 7:36 AM

## 2018-05-17 ENCOUNTER — Telehealth (INDEPENDENT_AMBULATORY_CARE_PROVIDER_SITE_OTHER): Payer: Self-pay | Admitting: Orthopaedic Surgery

## 2018-05-17 NOTE — Telephone Encounter (Signed)
yes

## 2018-05-17 NOTE — Telephone Encounter (Signed)
Ok on orders.  

## 2018-05-17 NOTE — Telephone Encounter (Signed)
Tiffany, PT, with [facility] called requesting VO for [facility] PT.  2x a week for 1 week 3x a week for 1 week 2x a week for 1 week  CB#[phone].  Thank you.

## 2018-05-18 NOTE — Telephone Encounter (Signed)
Called patient per Dr Erlinda Hong he approved on orders. LMOM with details.

## 2018-06-02 ENCOUNTER — Ambulatory Visit (INDEPENDENT_AMBULATORY_CARE_PROVIDER_SITE_OTHER): Payer: Medicare Other | Admitting: Orthopaedic Surgery

## 2018-06-02 ENCOUNTER — Encounter (INDEPENDENT_AMBULATORY_CARE_PROVIDER_SITE_OTHER): Payer: Self-pay | Admitting: Orthopaedic Surgery

## 2018-06-02 DIAGNOSIS — M1612 Unilateral primary osteoarthritis, left hip: Secondary | ICD-10-CM

## 2018-06-02 NOTE — Progress Notes (Signed)
Post-Op Visit Note   Patient: [name]           Date of Birth: April 08, 1941           MRN: [id number] Visit Date: 06/02/2018 PCP: Mayra Neer, MD   Assessment & Plan:  Chief Complaint:  Chief Complaint  Patient presents with  . Left Hip - Pain   Visit Diagnoses:  1. Primary osteoarthritis of left hip     Plan: Patient is two-week status post left total hip replacement and doing well she reports minimal pain.  She has completed home physical therapy.  Her incision is fully healed.  Leg lengths are equal.  She is ambulating with a cane.  She has good hip flexor strength and knee extension strength.  From my standpoint continue to increase activity as tolerated.  Continue with home exercise program.  Continue with aspirin DVT prophylaxis for another 4 weeks.  Follow-up in 4 weeks with standing AP pelvis and lateral left hip x-ray.  Follow-Up Instructions: Return in about 4 weeks (around 06/30/2018).   Orders:  No orders of the defined types were placed in this encounter.  No orders of the defined types were placed in this encounter.   Imaging: No results found.  PMFS History: Patient Active Problem List   Diagnosis Date Noted  . History of hip replacement 05/15/2018  . Primary osteoarthritis of left hip   . Pain of left hip joint 04/20/2018  . Breast cancer of upper-inner quadrant of right female breast ([place]) 03/04/2015   Past Medical History:  Diagnosis Date  . 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 External hemorrhoid, bleeding   . First degree heart block   . Glaucoma, both eyes   . Headache   . History of palpitations   . 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 Internal hemorrhoid, bleeding   . Personal  history of radiation therapy 2016  . Primary localized osteoarthritis of left hip   . S/P radiation therapy 05/05/2015 through 06/05/2015   Right breast (high tangents) 4800 cGy in 24 sessions , no boost    . Wears glasses   . Wears hearing aid    bilateral    Family History  Adopted: Yes    Past Surgical History:  Procedure Laterality Date  . ANTERIOR AND POSTERIOR REPAIR  06/10/2011   Procedure: ANTERIOR (CYSTOCELE) AND POSTERIOR REPAIR (RECTOCELE);  Surgeon: Blane Ohara Meisinger;  Location: [facility];  Service: Gynecology;  Laterality: N/A;  . APPENDECTOMY  as child  . BREAST BIOPSY Right 02/19/2017  . BREAST LUMPECTOMY Right 02/2015  . BREAST SURGERY Right 03/19/15   lumpectomy/2 nodes  . CARPAL TUNNEL RELEASE Left   . CHOLECYSTECTOMY OPEN  1974  . COLONOSCOPY W/ BIOPSIES AND POLYPECTOMY    . EVALUATION UNDER ANESTHESIA WITH HEMORRHOIDECTOMY N/A 01/30/2016   Procedure: EXAM UNDER ANESTHESIA POSSIBLE HEMORRHOIDAL PEXY SINGLE COLUMN  HEMORRHOIDECTOMY;  Surgeon: [name], MD;  Location: [place];  Service: General;  Laterality: N/A;  . HAMMER TOE SURGERY Left   . KNEE ARTHROSCOPY Right   . ORIF RIGHT ANKLE FX  1999  . RADIOACTIVE SEED GUIDED PARTIAL MASTECTOMY WITH AXILLARY SENTINEL LYMPH NODE BIOPSY Right 03/19/2015   Procedure: RIGHT BREAST  LUMPECTOMY WITH RADIOACTIVE SEED AND SENTINEL LYMPH NODE MAPPING;  Surgeon: Autumn Messing III, MD;  Location: [place];  Service: General;  Laterality: Right;  . TONSILLECTOMY  as child  . TOTAL HIP ARTHROPLASTY Left 05/15/2018   Procedure: LEFT TOTAL HIP ARTHROPLASTY ANTERIOR APPROACH;  Surgeon: Leandrew Koyanagi, MD;  Location: [place];  Service: Orthopedics;  Laterality: Left;  . TOTAL KNEE ARTHROPLASTY Right 05/23/2013   Procedure: Right Total Knee Arthroplasty;  Surgeon: Newt Minion, MD;  Location: [place];  Service: Orthopedics;  Laterality: Right;  Right Total Knee Arthroplasty  . TUBAL LIGATION     . VAGINAL HYSTERECTOMY  07-07-2010   w/  Anterior & Posterior Repair and Solyx mid-urethral sling  . VAGINAL PROLAPSE REPAIR  06/10/2011   Procedure: VAGINAL VAULT SUSPENSION;  Surgeon: Blane Ohara Meisinger;  Location: [facility];  Service: Gynecology;  Laterality: N/A;   Social History   Occupational History  . Not on file  Tobacco Use  . Smoking status: Never Smoker  . Smokeless tobacco: Never Used  Substance and Sexual Activity  . Alcohol use: No  . Drug use: No  . Sexual activity: Not on file

## 2018-06-30 ENCOUNTER — Ambulatory Visit (INDEPENDENT_AMBULATORY_CARE_PROVIDER_SITE_OTHER): Payer: Medicare Other

## 2018-06-30 ENCOUNTER — Ambulatory Visit (INDEPENDENT_AMBULATORY_CARE_PROVIDER_SITE_OTHER): Payer: Medicare Other | Admitting: Orthopaedic Surgery

## 2018-06-30 ENCOUNTER — Encounter (INDEPENDENT_AMBULATORY_CARE_PROVIDER_SITE_OTHER): Payer: Self-pay | Admitting: Orthopaedic Surgery

## 2018-06-30 DIAGNOSIS — M25552 Pain in left hip: Secondary | ICD-10-CM

## 2018-06-30 DIAGNOSIS — M1612 Unilateral primary osteoarthritis, left hip: Secondary | ICD-10-CM

## 2018-06-30 NOTE — Progress Notes (Signed)
Post-Op Visit Note   Patient: SINCLAIRE ARTIGA           Date of Birth: 1941/07/26           MRN: [id number] Visit Date: 06/30/2018 PCP: Mayra Neer, MD   Assessment & Plan:  Chief Complaint:  Chief Complaint  Patient presents with  . Left Hip - Pain   Visit Diagnoses:  1. Pain in left hip   2. Primary osteoarthritis of left hip     Plan: [place] 6 weeks status post left total hip replacement overall she is doing well.  She sometimes has some pain in the buttock and lateral hip area.  She is doing home exercises.  She has some soreness in the groin area.  Overall she is very happy with her progress at her improvement.  Her surgical scar is healed.  I did remove a stitch from the incisional spitting out.  Mupirocin ointment was placed.  Overall she is progressing as expected.  Continue to increase activity as tolerated.  Recheck in 6 weeks.  Follow-Up Instructions: Return in about 6 weeks (around 08/11/2018).   Orders:  Orders Placed This Encounter  Procedures  . XR HIP UNILAT W OR W/O PELVIS 2-3 VIEWS LEFT   No orders of the defined types were placed in this encounter.   Imaging: Xr Hip Unilat W Or W/o Pelvis 2-3 Views Left  Result Date: 06/30/2018 Stable left total hip replacement in good alignment   PMFS History: Patient Active Problem List   Diagnosis Date Noted  . History of hip replacement 05/15/2018  . Primary osteoarthritis of left hip   . Pain of left hip joint 04/20/2018  . Breast cancer of upper-inner quadrant of right female breast ([place]) 03/04/2015   Past Medical History:  Diagnosis Date  . 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 External hemorrhoid, bleeding   . First degree heart block   . Glaucoma, both eyes   . Headache    . History of palpitations   . 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 Internal hemorrhoid, bleeding   . Personal history of radiation therapy 2016  . Primary localized osteoarthritis of left hip   . S/P radiation therapy 05/05/2015 through 06/05/2015   Right breast (high tangents) 4800 cGy in 24 sessions , no boost    . Wears glasses   . Wears hearing aid    bilateral    Family History  Adopted: Yes    Past Surgical History:  Procedure Laterality Date  . ANTERIOR AND POSTERIOR REPAIR  06/10/2011   Procedure: ANTERIOR (CYSTOCELE) AND POSTERIOR REPAIR (RECTOCELE);  Surgeon: Blane Ohara Meisinger;  Location: [facility];  Service: Gynecology;  Laterality: N/A;  . APPENDECTOMY  as child  . BREAST BIOPSY Right 02/19/2017  . BREAST LUMPECTOMY Right 02/2015  . BREAST SURGERY Right 03/19/15   lumpectomy/2 nodes  . CARPAL TUNNEL RELEASE Left   . CHOLECYSTECTOMY OPEN  1974  . COLONOSCOPY W/ BIOPSIES AND POLYPECTOMY    . EVALUATION UNDER ANESTHESIA WITH HEMORRHOIDECTOMY N/A 01/30/2016   Procedure: EXAM UNDER ANESTHESIA POSSIBLE HEMORRHOIDAL PEXY SINGLE COLUMN  HEMORRHOIDECTOMY;  Surgeon: [name], MD;  Location: [place];  Service: General;  Laterality: N/A;  . HAMMER  TOE SURGERY Left   . KNEE ARTHROSCOPY Right   . ORIF RIGHT ANKLE FX  1999  . RADIOACTIVE SEED GUIDED PARTIAL MASTECTOMY WITH AXILLARY SENTINEL LYMPH NODE BIOPSY Right 03/19/2015   Procedure: RIGHT BREAST LUMPECTOMY WITH RADIOACTIVE SEED AND SENTINEL LYMPH NODE MAPPING;  Surgeon: Autumn Messing III, MD;  Location: [place];  Service: General;  Laterality: Right;  . TONSILLECTOMY  as child  . TOTAL HIP ARTHROPLASTY Left 05/15/2018   Procedure: LEFT TOTAL HIP ARTHROPLASTY ANTERIOR APPROACH;  Surgeon: Leandrew Koyanagi, MD;  Location: [place];  Service: Orthopedics;  Laterality: Left;  . TOTAL KNEE ARTHROPLASTY Right 05/23/2013   Procedure: Right Total Knee Arthroplasty;   Surgeon: Newt Minion, MD;  Location: [place];  Service: Orthopedics;  Laterality: Right;  Right Total Knee Arthroplasty  . TUBAL LIGATION    . VAGINAL HYSTERECTOMY  07-07-2010   w/  Anterior & Posterior Repair and Solyx mid-urethral sling  . VAGINAL PROLAPSE REPAIR  06/10/2011   Procedure: VAGINAL VAULT SUSPENSION;  Surgeon: Blane Ohara Meisinger;  Location: [place] [facility];  Service: Gynecology;  Laterality: N/A;   Social History   Occupational History  . Not on file  Tobacco Use  . Smoking status: Never Smoker  . Smokeless tobacco: Never Used  Substance and Sexual Activity  . Alcohol use: No  . Drug use: No  . Sexual activity: Not on file

## 2018-08-11 ENCOUNTER — Encounter (INDEPENDENT_AMBULATORY_CARE_PROVIDER_SITE_OTHER): Payer: Self-pay | Admitting: Orthopaedic Surgery

## 2018-08-11 ENCOUNTER — Ambulatory Visit (INDEPENDENT_AMBULATORY_CARE_PROVIDER_SITE_OTHER): Payer: Medicare Other | Admitting: Orthopaedic Surgery

## 2018-08-11 DIAGNOSIS — M1612 Unilateral primary osteoarthritis, left hip: Secondary | ICD-10-CM

## 2018-08-11 NOTE — Progress Notes (Signed)
Patient: [name]           Date of Birth: December 14, 1940           MRN: [id number] Visit Date: 08/11/2018 PCP: Pamela Neer, MD   Assessment & Plan:  Chief Complaint:  Chief Complaint  Patient presents with  . Left Hip - Pain   Visit Diagnoses:  1. Primary osteoarthritis of left hip     Plan: [name] is doing very well today for her 40-month follow-up status post left total hip replacement.  She continues to do home exercise.  She has occasional discomfort in her hip but overall she has normal activities. Surgical scar is healed with no signs of complications or infection.  The patient does not complain of pain, and is back to normal daily activities.  It was reinforced that prophylactic antibiotics should be taken with any procedure including but not limited to dental work or colonoscopies.  We will plan on following up at the 6 month postop visit with radiographs at that time. As always, instructions were given to call with any questions or concerns in the interim.  Follow-Up Instructions: Return in about 3 months (around 11/11/2018).   Orders:  No orders of the defined types were placed in this encounter.  No orders of the defined types were placed in this encounter.   Imaging: No results found.  PMFS History: Patient Active Problem List   Diagnosis Date Noted  . History of hip replacement 05/15/2018  . Primary osteoarthritis of left hip   . Pain of left hip joint 04/20/2018  . Breast cancer of upper-inner quadrant of right female breast ([place]) 03/04/2015   Past Medical History:  Diagnosis Date  . 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 External hemorrhoid, bleeding   . First degree heart block   . Glaucoma, both eyes   . Headache     . History of palpitations   . 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 Internal hemorrhoid, bleeding   . Personal history of radiation therapy 2016  . Primary localized osteoarthritis of left hip   . S/P radiation therapy 05/05/2015 through 06/05/2015   Right breast (high tangents) 4800 cGy in 24 sessions , no boost    . Wears glasses   . Wears hearing aid    bilateral    Family History  Adopted: Yes    Past Surgical History:  Procedure Laterality Date  . ANTERIOR AND POSTERIOR REPAIR  06/10/2011   Procedure: ANTERIOR (CYSTOCELE) AND POSTERIOR REPAIR (RECTOCELE);  Surgeon: [name];  Location: [place] [facility];  Service: Gynecology;  Laterality: N/A;  . APPENDECTOMY  as child  . BREAST BIOPSY Right 02/19/2017  . BREAST LUMPECTOMY Right 02/2015  . BREAST SURGERY Right 03/19/15   lumpectomy/2 nodes  . CARPAL TUNNEL RELEASE Left   . CHOLECYSTECTOMY OPEN  1974  . COLONOSCOPY W/ BIOPSIES AND POLYPECTOMY    . EVALUATION UNDER ANESTHESIA WITH HEMORRHOIDECTOMY N/A 01/30/2016   Procedure: EXAM UNDER ANESTHESIA POSSIBLE HEMORRHOIDAL PEXY SINGLE COLUMN  HEMORRHOIDECTOMY;  Surgeon: Pamela Ruff, MD;  Location: [place];  Service: General;  Laterality: N/A;  . HAMMER TOE SURGERY Left   . KNEE ARTHROSCOPY Right   . ORIF RIGHT ANKLE  FX  1999  . RADIOACTIVE SEED GUIDED PARTIAL MASTECTOMY WITH AXILLARY SENTINEL LYMPH NODE BIOPSY Right 03/19/2015   Procedure: RIGHT BREAST LUMPECTOMY WITH RADIOACTIVE SEED AND SENTINEL LYMPH NODE MAPPING;  Surgeon: Pamela Messing III, MD;  Location: [place];  Service: General;  Laterality: Right;  . TONSILLECTOMY  as child  . TOTAL HIP ARTHROPLASTY Left 05/15/2018   Procedure: LEFT TOTAL HIP ARTHROPLASTY ANTERIOR APPROACH;  Surgeon: Pamela Koyanagi, MD;  Location: [place];  Service: Orthopedics;  Laterality: Left;  . TOTAL KNEE ARTHROPLASTY Right 05/23/2013   Procedure: Right Total Knee Arthroplasty;   Surgeon: Pamela Minion, MD;  Location: [place];  Service: Orthopedics;  Laterality: Right;  Right Total Knee Arthroplasty  . TUBAL LIGATION    . VAGINAL HYSTERECTOMY  07-07-2010   w/  Anterior & Posterior Repair and Solyx mid-urethral sling  . VAGINAL PROLAPSE REPAIR  06/10/2011   Procedure: VAGINAL VAULT SUSPENSION;  Surgeon: [name];  Location: [facility];  Service: Gynecology;  Laterality: N/A;   Social History   Occupational History  . Not on file  Tobacco Use  . Smoking status: Never Smoker  . Smokeless tobacco: Never Used  Substance and Sexual Activity  . Alcohol use: No  . Drug use: No  . Sexual activity: Not on file

## 2018-09-12 ENCOUNTER — Other Ambulatory Visit (INDEPENDENT_AMBULATORY_CARE_PROVIDER_SITE_OTHER): Payer: Self-pay

## 2018-09-12 ENCOUNTER — Telehealth (INDEPENDENT_AMBULATORY_CARE_PROVIDER_SITE_OTHER): Payer: Self-pay | Admitting: Orthopaedic Surgery

## 2018-09-12 MED ORDER — AMOXICILLIN 500 MG PO TABS: ORAL_TABLET | ORAL | 0 refills | Status: DC

## 2018-09-12 NOTE — Telephone Encounter (Signed)
2 g amoxicillin

## 2018-09-12 NOTE — Telephone Encounter (Signed)
Please advise 

## 2018-09-12 NOTE — Telephone Encounter (Signed)
Called into pharm. Called patient she is aware.

## 2018-09-12 NOTE — Telephone Encounter (Signed)
Patient called advised she is going to the dentist Thursday morning and she was asked to get an antibiotic prior to her appointment. [facility] [address]. The number to contact patient is [phone]

## 2018-10-30 ENCOUNTER — Telehealth: Payer: Self-pay | Admitting: Hematology and Oncology

## 2018-10-30 NOTE — Telephone Encounter (Signed)
Called pt re appt change due to [facility] having a half day - - left voicemail with appt changes.

## 2018-11-01 NOTE — Progress Notes (Signed)
Patient Care Team: Mayra Neer, MD as PCP - General (Family Medicine) Nicholas Lose, MD as Consulting Physician (Hematology and Oncology) Jovita Kussmaul, MD as Consulting Physician (General Surgery) Arloa Koh, MD as Consulting Physician (Radiation Oncology) Sylvan Cheese, NP as Nurse Practitioner (Hematology and Oncology)  DIAGNOSIS:    ICD-10-CM   1. Malignant neoplasm of upper-inner quadrant of right breast in female, estrogen receptor positive ([place]) C50.211    Z17.0     SUMMARY OF ONCOLOGIC HISTORY:   Breast cancer of upper-inner quadrant of right female breast ([place])   02/03/2015 Mammogram    Right breast: possible abnormality requiring further imaging    02/06/2015 Breast US    Right breast: no correlate seen    02/20/2015 Initial Biopsy    Right breast core needle bx: Invasive ductal carcinoma, grade 1, with DCIS; ER+ (99%), PR+ (98%), HER2/neu negative (ratio 1.71), Ki67 12%.    02/25/2015 Breast MRI    Right breast: 1.3 x 0.6 x 0.6 cm bilobed area of nodularity in the right breast, post biopsy change    02/25/2015 Clinical Stage    Stage IA: T1c N0    03/19/2015 Definitive Surgery    Right lumpectomy / [facility] Marlou Starks): IDC 1.7 cm with DCIS, micromet in one lymph node measuring 2 mm negative for ECS, other lymph node negative;  ER+ (99%), PR+ (98%), HER-2 negative, Ki-67 12%    03/19/2015 Pathologic Stage    Stage IIA: pT1c pN1a     03/19/2015 Procedure    Mammaprint low risk luminal A    05/05/2015 - 06/05/2015 Radiation Therapy    Adjuvant RT Valere Dross): Right breast 48 Gy over 24 fractions     06/19/2015 -  Anti-estrogen oral therapy    Anastrozole 1 mg daily. Switched to letrozole 08/05/2016 due to trigger fingers    09/11/2015 Survivorship    Survivorship visit completed and copy of care plan provided to patient    05/15/2018 Surgery    Left total hip arthroplasty     CHIEF COMPLIANT: Follow-up of letrozole therapy  INTERVAL HISTORY: [name] is a 78 y.o. with above-mentioned history of right breast cancer treated with lumpectomy, followed by radiatio, and is currently on antiestrogen therapy with letrozole for 3.5 years because she could not tolerate anastrozole. I last saw the patient one year ago. Her most recent mammogram on 02/20/18 showed no evidence of malignancy bilaterally. She had a hip replacement in 04/2018. She presents to the clinic today with her husband. Her trigger finger symptoms have gone away since switching to letrozole. She reports she fell last week and is very bruised and in pain in her left breast and chest area. She is trying to exercise regularly. She reviewed her medication list with me.   REVIEW OF SYSTEMS:   Constitutional: Denies fevers, chills or abnormal weight loss Eyes: Denies blurriness of vision Ears, nose, mouth, throat, and face: Denies mucositis or sore throat Respiratory: Denies cough, dyspnea or wheezes Cardiovascular: Denies palpitation, chest discomfort Gastrointestinal:  Denies nausea, heartburn or change in bowel habits Skin: Denies abnormal skin rashes Lymphatics: Denies new lymphadenopathy or easy bruising Neurological:Denies numbness, tingling or new weaknesses Behavioral/Psych: Mood is stable, no new changes  Extremities: No lower extremity edema Breast: denies any lumps or nodules in either breasts (+) pain and bruising in left breast from fall All other systems were reviewed with the patient and are negative.  I have reviewed the past medical history, past surgical history, social history  and family history with the patient and they are unchanged from previous note.  ALLERGIES:  is allergic to statins.  MEDICATIONS:  Current Outpatient Medications  Medication Sig Dispense Refill  . amoxicillin (AMOXIL) 500 MG tablet TAKE 4 TABS PO 30 MIN BEFORE PROCEDURE 4 tablet 0  . aspirin EC 81 MG tablet Take 1 tablet (81 mg total) by mouth daily. 84 tablet 0  . bimatoprost (LUMIGAN)  0.01 % SOLN Place 1 drop into both eyes at bedtime.     . Calcium-Vitamin D (CALTRATE 600 PLUS-VIT D PO) Take 2 tablets by mouth daily with supper.     . dorzolamide-timolol (COSOPT) 22.3-6.8 MG/ML ophthalmic solution Place 1 drop into both eyes 2 (two) times daily.     Marland Kitchen ezetimibe (ZETIA) 10 MG tablet Take 10 mg by mouth daily with supper.     Marland Kitchen letrozole (FEMARA) 2.5 MG tablet Take 1 tablet (2.5 mg total) by mouth daily. 90 tablet 3  . levothyroxine (SYNTHROID, LEVOTHROID) 100 MCG tablet Take 100 mcg by mouth daily before breakfast.    . multivitamin (THERAGRAN) per tablet Take 1 tablet by mouth daily.     . Omega-3 Fatty Acids (FISH OIL OMEGA-3) 1000 MG CAPS Take 2 tablets by mouth 2 (two) times daily. 60 capsule   . polyethylene glycol (MIRALAX / GLYCOLAX) packet Take 17 g by mouth daily as needed for mild constipation.     . senna-docusate (SENOKOT S) 8.6-50 MG tablet Take 1 tablet by mouth at bedtime as needed. 30 tablet 1   No current facility-administered medications for this visit.     PHYSICAL EXAMINATION: ECOG PERFORMANCE STATUS: 1 - Symptomatic but completely ambulatory  Vitals:   11/02/18 0941  BP: 109/80  Pulse: 66  Resp: 17  Temp: 97.8 F (36.6 C)  SpO2: 98%   Filed Weights   11/02/18 0941  Weight: 187 lb 12.8 oz (85.2 kg)    GENERAL:alert, no distress and comfortable SKIN: skin color, texture, turgor are normal, no rashes or significant lesions EYES: normal, Conjunctiva are pink and non-injected, sclera clear OROPHARYNX:no exudate, no erythema and lips, buccal mucosa, and tongue normal  NECK: supple, thyroid normal size, non-tender, without nodularity LYMPH:  no palpable lymphadenopathy in the cervical, axillary or inguinal LUNGS: clear to auscultation and percussion with normal breathing effort HEART: regular rate & rhythm and no murmurs and no lower extremity edema ABDOMEN:abdomen soft, non-tender and normal bowel sounds MUSCULOSKELETAL:no cyanosis of digits  and no clubbing  NEURO: alert & oriented x 3 with fluent speech, no focal motor/sensory deficits EXTREMITIES: No lower extremity edema BREAST: No palpable lumps or nodules in the breast.  There is a big bruise on the left breast from the recent fall.  It does not feel warm.  (exam performed in the presence of a chaperone)  LABORATORY DATA:  I have reviewed the data as listed CMP Latest Ref Rng & Units 05/16/2018 05/04/2018 05/24/2013  Glucose 70 - 99 mg/dL 161(H) 115(H) 143(H)  BUN 8 - 23 mg/dL _0 Creatinine 0.44 - 1.00 mg/dL 0.72 0.76 0.83  Sodium 135 - 145 mmol/L 138 141 139  Potassium 3.5 - 5.1 mmol/L 3.5 3.8 3.6  Chloride 98 - 111 mmol/L 107 106 105  CO2 22 - 32 mmol/L _1 Calcium 8.9 - 10.3 mg/dL 8.3(L) 9.3 8.8  Total Protein 6.5 - 8.1 g/dL - 6.8 -  Total Bilirubin 0.3 - 1.2 mg/dL - 1.1 -  Alkaline Phos 38 - 126  U/L - 74 -  AST 15 - 41 U/L - 21 -  ALT 0 - 44 U/L - 16 -    Lab Results  Component Value Date   WBC 10.7 (H) 05/16/2018   HGB 10.1 (L) 05/16/2018   HCT 30.0 (L) 05/16/2018   MCV 96.8 05/16/2018   PLT 163 05/16/2018   NEUTROABS 3.0 05/04/2018    ASSESSMENT & PLAN:  Breast cancer of upper-inner quadrant of right female breast Right lumpectomy 03/19/2015: IDC 1.7 cm with DCIS, micromet in one lymph node measuring 2 mm negative for ECS, other lymph node negative, T1cN1a stage II a, ER 99%, PR 98%, HER-2 negative, Ki-67 12%, Mammaprint low-risk luminal A, status post adjuvant radiation, started anastrozole 1 mg daily 08/24/2016changed to Letrozole 08/05/16  Letrozole toxicities: Marked improvement in joint aches and pains and trigger fingers. Patient denies any hot flashes. She needs a bone density every other year. Treatment duration between 5 to 7 years.  Breast cancer surveillance: 1. Breast exam1/06/2019: No palpable lumps or nodules, bruised from the recent fall left breast 2. Annual mammograms 02/20/2018: Benign, breast density category  B  Return to clinic in1 yearfor follow-up    No orders of the defined types were placed in this encounter.  The patient has a good understanding of the overall plan. she agrees with it. she will call with any problems that may develop before the next visit here.  Nicholas Lose, MD 11/02/2018   I, Molly Dorshimer, am acting as scribe for Nicholas Lose, MD.  I have reviewed the above documentation for accuracy and completeness, and I agree with the above.

## 2018-11-02 ENCOUNTER — Telehealth: Payer: Self-pay | Admitting: Hematology and Oncology

## 2018-11-02 ENCOUNTER — Inpatient Hospital Stay: Payer: Medicare Other | Attending: Hematology and Oncology | Admitting: Hematology and Oncology

## 2018-11-02 DIAGNOSIS — Z79899 Other long term (current) drug therapy: Secondary | ICD-10-CM | POA: Insufficient documentation

## 2018-11-02 DIAGNOSIS — Z923 Personal history of irradiation: Secondary | ICD-10-CM | POA: Insufficient documentation

## 2018-11-02 DIAGNOSIS — Z79811 Long term (current) use of aromatase inhibitors: Secondary | ICD-10-CM | POA: Diagnosis not present

## 2018-11-02 DIAGNOSIS — Z170 Estrogen receptor positive status [ER+]: Secondary | ICD-10-CM | POA: Diagnosis not present

## 2018-11-02 DIAGNOSIS — C50211 Malignant neoplasm of upper-inner quadrant of right female breast: Secondary | ICD-10-CM

## 2018-11-02 DIAGNOSIS — Z7982 Long term (current) use of aspirin: Secondary | ICD-10-CM | POA: Diagnosis not present

## 2018-11-02 MED ORDER — FISH OIL OMEGA-3 1000 MG PO CAPS: 2.0000 | ORAL_CAPSULE | Freq: Two times a day (BID) | ORAL | Status: AC

## 2018-11-02 MED ORDER — ASPIRIN EC 81 MG PO TBEC: 81.0000 mg | DELAYED_RELEASE_TABLET | Freq: Every day | ORAL | 0 refills | Status: AC

## 2018-11-02 MED ORDER — LETROZOLE 2.5 MG PO TABS: 2.5000 mg | ORAL_TABLET | Freq: Every day | ORAL | 3 refills | Status: DC

## 2018-11-02 NOTE — Telephone Encounter (Signed)
Patient decline avs and calendar °

## 2018-11-02 NOTE — Assessment & Plan Note (Addendum)
Right lumpectomy 03/19/2015: IDC 1.7 cm with DCIS, micromet in one lymph node measuring 2 mm negative for ECS, other lymph node negative, T1cN1a stage II a, ER 99%, PR 98%, HER-2 negative, Ki-67 12%, Mammaprint low-risk luminal A, status post adjuvant radiation, started anastrozole 1 mg daily 08/24/2016changed to Letrozole 08/05/16  Letrozole toxicities: Marked improvement in joint aches and pains and trigger fingers. Patient denies any hot flashes. She needs a bone density every other year. Treatment duration between 5 to 7 years.  Breast cancer surveillance: 1. Breast exam1/06/2019: No palpable lumps or nodules, bruised from the recent fall left breast 2. Annual mammograms 02/20/2018: Benign, breast density category B  Return to clinic in1 yearfor follow-up

## 2018-11-10 ENCOUNTER — Encounter (INDEPENDENT_AMBULATORY_CARE_PROVIDER_SITE_OTHER): Payer: Self-pay | Admitting: Orthopaedic Surgery

## 2018-11-10 ENCOUNTER — Ambulatory Visit (INDEPENDENT_AMBULATORY_CARE_PROVIDER_SITE_OTHER): Payer: Medicare Other | Admitting: Orthopaedic Surgery

## 2018-11-10 ENCOUNTER — Ambulatory Visit (INDEPENDENT_AMBULATORY_CARE_PROVIDER_SITE_OTHER): Payer: Self-pay

## 2018-11-10 DIAGNOSIS — Z96642 Presence of left artificial hip joint: Secondary | ICD-10-CM | POA: Diagnosis not present

## 2018-11-10 NOTE — Progress Notes (Signed)
Post-Op Visit Note   Patient: [name]           Date of Birth: 06-Oct-1941           MRN: [id number] Visit Date: 11/10/2018 PCP: Mayra Neer, MD   Assessment & Plan:  Chief Complaint:  Chief Complaint  Patient presents with  . Left Hip - Pain   Visit Diagnoses:  1. Status post total hip replacement, left     Plan: Patient is a pleasant 78 year old female who presents to our clinic today 4-month status post left anterior total hip replacement, date of surgery 05/15/2018.  She has been doing very well.  Occasional pain, but this is only when she is trying to sleep in a certain position at night.  She continues to work on a home exercise program where she is consistently regaining strength.  Not all the way there yet.  Examination of the left hip reveals well-healed surgical incision.  Full hip flexion.  4 out of 5 strength.  She is neurovascularly intact distally.  At this point, would like for her to continue working on her home exercise program.  Dental prophylaxis reinforced.  Follow-up with [place] in 6 months time for repeat evaluation and two-view x-rays.  Follow-Up Instructions: Return in about 6 months (around 05/11/2019).   Orders:  Orders Placed This Encounter  Procedures  . XR HIP UNILAT W OR W/O PELVIS 2-3 VIEWS LEFT   No orders of the defined types were placed in this encounter.   Imaging: Xr Hip Unilat W Or W/o Pelvis 2-3 Views Left  Result Date: 11/10/2018 X-rays demonstrate a well-seated prosthesis without evidence of subsidence or ostial lysis   PMFS History: Patient Active Problem List   Diagnosis Date Noted  . History of hip replacement 05/15/2018  . Primary osteoarthritis of left hip   . Pain of left hip joint 04/20/2018  . Breast cancer of upper-inner quadrant of right female breast ([place]) 03/04/2015   Past Medical History:  Diagnosis Date  . Breast cancer [facility]) dx Apr 2016--  oncologist-  dr Soyla Dryer   right breast Stage 2A (pT1c pN1a) DCIS  ,Grade 1, ER/PR +,  s/p  right mastectomy w/ node dissection 03-19-2015 and Adjuvant RXT (05-05-2015 to 06-05-2015)/  currently anit-estrogen therapy w/ anastrozole  . External hemorrhoid, bleeding   . First degree heart block   . Glaucoma, both eyes   . Headache   . History of palpitations   . 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 Internal hemorrhoid, bleeding   . Personal history of radiation therapy 2016  . Primary localized osteoarthritis of left hip   . S/P radiation therapy 05/05/2015 through 06/05/2015   Right breast (high tangents) 4800 cGy in 24 sessions , no boost    . Wears glasses   . Wears hearing aid    bilateral    Family History  Adopted: Yes    Past Surgical History:  Procedure Laterality Date  . ANTERIOR AND POSTERIOR REPAIR  06/10/2011   Procedure: ANTERIOR (CYSTOCELE) AND POSTERIOR REPAIR (RECTOCELE);  Surgeon: Blane Ohara Meisinger;  Location: [facility];  Service: Gynecology;  Laterality: N/A;  . APPENDECTOMY  as child  . BREAST BIOPSY Right 02/19/2017  . BREAST LUMPECTOMY Right 02/2015  . BREAST SURGERY Right 03/19/15   lumpectomy/2 nodes  . CARPAL TUNNEL RELEASE Left   . CHOLECYSTECTOMY OPEN  1974  . COLONOSCOPY W/ BIOPSIES AND POLYPECTOMY    . EVALUATION UNDER ANESTHESIA WITH  HEMORRHOIDECTOMY N/A 01/30/2016   Procedure: EXAM UNDER ANESTHESIA POSSIBLE HEMORRHOIDAL PEXY SINGLE COLUMN  HEMORRHOIDECTOMY;  Surgeon: [name], MD;  Location: [place];  Service: General;  Laterality: N/A;  . HAMMER TOE SURGERY Left   . KNEE ARTHROSCOPY Right   . ORIF RIGHT ANKLE FX  1999  . RADIOACTIVE SEED GUIDED PARTIAL MASTECTOMY WITH AXILLARY SENTINEL LYMPH NODE BIOPSY Right 03/19/2015   Procedure: RIGHT BREAST LUMPECTOMY WITH RADIOACTIVE SEED AND SENTINEL LYMPH NODE MAPPING;  Surgeon: Autumn Messing III, MD;  Location: [place];  Service: General;  Laterality: Right;  . TONSILLECTOMY  as child  . TOTAL HIP  ARTHROPLASTY Left 05/15/2018   Procedure: LEFT TOTAL HIP ARTHROPLASTY ANTERIOR APPROACH;  Surgeon: Leandrew Koyanagi, MD;  Location: [place];  Service: Orthopedics;  Laterality: Left;  . TOTAL KNEE ARTHROPLASTY Right 05/23/2013   Procedure: Right Total Knee Arthroplasty;  Surgeon: Newt Minion, MD;  Location: [place];  Service: Orthopedics;  Laterality: Right;  Right Total Knee Arthroplasty  . TUBAL LIGATION    . VAGINAL HYSTERECTOMY  07-07-2010   w/  Anterior & Posterior Repair and Solyx mid-urethral sling  . VAGINAL PROLAPSE REPAIR  06/10/2011   Procedure: VAGINAL VAULT SUSPENSION;  Surgeon: Blane Ohara Meisinger;  Location: [facility];  Service: Gynecology;  Laterality: N/A;   Social History   Occupational History  . Not on file  Tobacco Use  . Smoking status: Never Smoker  . Smokeless tobacco: Never Used  Substance and Sexual Activity  . Alcohol use: No  . Drug use: No  . Sexual activity: Not on file

## 2019-01-22 ENCOUNTER — Other Ambulatory Visit: Payer: Self-pay | Admitting: Hematology and Oncology

## 2019-01-22 DIAGNOSIS — Z853 Personal history of malignant neoplasm of breast: Secondary | ICD-10-CM

## 2019-02-23 ENCOUNTER — Ambulatory Visit
Admission: RE | Admit: 2019-02-23 | Discharge: 2019-02-23 | Disposition: A | Discharge status: Home / Self Care | Payer: Medicare Other | Source: Ambulatory Visit | Attending: Hematology and Oncology | Admitting: Hematology and Oncology

## 2019-02-23 ENCOUNTER — Other Ambulatory Visit: Payer: Self-pay | Admitting: Hematology and Oncology

## 2019-02-23 ENCOUNTER — Inpatient Hospital Stay: Admission: RE | Admit: 2019-02-23 | Payer: Medicare Other | Source: Ambulatory Visit

## 2019-02-23 ENCOUNTER — Other Ambulatory Visit: Payer: Self-pay

## 2019-02-23 DIAGNOSIS — Z853 Personal history of malignant neoplasm of breast: Secondary | ICD-10-CM

## 2019-02-27 ENCOUNTER — Other Ambulatory Visit: Payer: Self-pay

## 2019-02-27 ENCOUNTER — Other Ambulatory Visit: Payer: Self-pay | Admitting: Hematology and Oncology

## 2019-02-27 ENCOUNTER — Ambulatory Visit
Admission: RE | Admit: 2019-02-27 | Discharge: 2019-02-27 | Disposition: A | Discharge status: Home / Self Care | Payer: Medicare Other | Source: Ambulatory Visit | Attending: Hematology and Oncology | Admitting: Hematology and Oncology

## 2019-02-27 DIAGNOSIS — N6310 Unspecified lump in the right breast, unspecified quadrant: Secondary | ICD-10-CM

## 2019-02-27 DIAGNOSIS — Z853 Personal history of malignant neoplasm of breast: Secondary | ICD-10-CM

## 2019-02-27 HISTORY — PX: BREAST BIOPSY: SHX20

## 2019-05-11 ENCOUNTER — Other Ambulatory Visit: Payer: Self-pay

## 2019-05-11 ENCOUNTER — Encounter: Payer: Self-pay | Admitting: Physician Assistant

## 2019-05-11 ENCOUNTER — Ambulatory Visit (INDEPENDENT_AMBULATORY_CARE_PROVIDER_SITE_OTHER): Payer: Medicare Other | Admitting: Orthopaedic Surgery

## 2019-05-11 ENCOUNTER — Ambulatory Visit: Payer: Self-pay

## 2019-05-11 ENCOUNTER — Ambulatory Visit: Payer: Self-pay | Admitting: Orthopaedic Surgery

## 2019-05-11 DIAGNOSIS — Z96642 Presence of left artificial hip joint: Secondary | ICD-10-CM | POA: Diagnosis not present

## 2019-05-11 MED ORDER — AMOXICILLIN 500 MG PO CAPS: ORAL_CAPSULE | ORAL | 1 refills | Status: DC

## 2019-05-11 NOTE — Progress Notes (Signed)
Post-Op Visit Note   Patient: [name]           Date of Birth: 1941/01/04           MRN: [id number] Visit Date: 05/11/2019 PCP: Mayra Neer, MD   Assessment & Plan:  Chief Complaint:  Chief Complaint  Patient presents with  . Left Hip - Follow-up   Visit Diagnoses:  1. History of left hip replacement     Plan: Patient is a pleasant 78 year old female who presents our clinic today 1 year status post left anterior total hip replacement, date of surgery 05/15/2018.  She has been doing fairly well.  She does have times where she has slight increased pain and lack of strength, but overall this is getting less frequent.  She has not really been working on strengthening exercises.  Overall, feeling well.  Stable exam of the left hip.  At this point, encouraged her to work on strengthening exercises to help with strength and endurance issues.  Dental prophylaxis encouraged.  She will follow-up with [place] in 1 years time for repeat evaluation and x-rays.  Call with concerns or questions in meantime.  Follow-Up Instructions: Return in about 1 year (around 05/10/2020).   Orders:  Orders Placed This Encounter  Procedures  . XR HIP UNILAT W OR W/O PELVIS 1V LEFT   Meds ordered this encounter  Medications  . amoxicillin (AMOXIL) 500 MG capsule    Sig: Take 4 pills one hour prior to dental procedure    Dispense:  12 capsule    Refill:  1    Imaging: Xr Hip Unilat W Or W/o Pelvis 1v Left  Result Date: 05/11/2019 X-rays demonstrate a well-seated prosthesis without complication   PMFS History: Patient Active Problem List   Diagnosis Date Noted  . History of hip replacement 05/15/2018  . Primary osteoarthritis of left hip   . Pain of left hip joint 04/20/2018  . Breast cancer of upper-inner quadrant of right female breast ([place]) 03/04/2015   Past Medical History:  Diagnosis Date  . 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 External hemorrhoid, bleeding   . First degree heart block   . Glaucoma, both eyes   . Headache   . History of palpitations   . 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 Internal hemorrhoid, bleeding   . Personal history of radiation therapy 2016  . Primary localized osteoarthritis of left hip   . S/P radiation therapy 05/05/2015 through 06/05/2015   Right breast (high tangents) 4800 cGy in 24 sessions , no boost    . Wears glasses   . Wears hearing aid    bilateral    Family History  Adopted: Yes    Past Surgical History:  Procedure Laterality Date  . ANTERIOR AND POSTERIOR REPAIR  06/10/2011   Procedure: ANTERIOR (CYSTOCELE) AND POSTERIOR REPAIR (RECTOCELE);  Surgeon: Blane Ohara Meisinger;  Location: [facility];  Service: Gynecology;  Laterality: N/A;  . APPENDECTOMY  as child  . BREAST BIOPSY Right 02/19/2017  . BREAST LUMPECTOMY Right 02/2015  . BREAST SURGERY Right 03/19/15   lumpectomy/2 nodes  . CARPAL TUNNEL RELEASE Left   . CHOLECYSTECTOMY OPEN  1974  . COLONOSCOPY W/ BIOPSIES AND POLYPECTOMY    .  EVALUATION UNDER ANESTHESIA WITH HEMORRHOIDECTOMY N/A 01/30/2016   Procedure: EXAM UNDER ANESTHESIA POSSIBLE HEMORRHOIDAL PEXY SINGLE COLUMN  HEMORRHOIDECTOMY;  Surgeon: [name], MD;  Location: [facility];  Service: General;  Laterality: N/A;  . HAMMER TOE SURGERY Left   . KNEE ARTHROSCOPY Right   . ORIF RIGHT ANKLE FX  1999  . RADIOACTIVE SEED GUIDED PARTIAL MASTECTOMY WITH AXILLARY SENTINEL LYMPH NODE BIOPSY Right 03/19/2015   Procedure: RIGHT BREAST LUMPECTOMY WITH RADIOACTIVE SEED AND SENTINEL LYMPH NODE MAPPING;  Surgeon: Autumn Messing III, MD;  Location: [facility];  Service: General;  Laterality: Right;  . TONSILLECTOMY  as child   . TOTAL HIP ARTHROPLASTY Left 05/15/2018   Procedure: LEFT TOTAL HIP ARTHROPLASTY ANTERIOR APPROACH;  Surgeon: Leandrew Koyanagi, MD;  Location: [place];  Service: Orthopedics;  Laterality: Left;  . TOTAL KNEE ARTHROPLASTY Right 05/23/2013   Procedure: Right Total Knee Arthroplasty;  Surgeon: Newt Minion, MD;  Location: [facility];  Service: Orthopedics;  Laterality: Right;  Right Total Knee Arthroplasty  . TUBAL LIGATION    . VAGINAL HYSTERECTOMY  07-07-2010   w/  Anterior & Posterior Repair and Solyx mid-urethral sling  . VAGINAL PROLAPSE REPAIR  06/10/2011   Procedure: VAGINAL VAULT SUSPENSION;  Surgeon: Blane Ohara Meisinger;  Location: [facility];  Service: Gynecology;  Laterality: N/A;   Social History   Occupational History  . Not on file  Tobacco Use  . Smoking status: Never Smoker  . Smokeless tobacco: Never Used  Substance and Sexual Activity  . Alcohol use: No  . Drug use: No  . Sexual activity: Not on file

## 2019-06-14 ENCOUNTER — Other Ambulatory Visit: Payer: Self-pay | Admitting: Hematology and Oncology

## 2019-06-14 DIAGNOSIS — Z170 Estrogen receptor positive status [ER+]: Secondary | ICD-10-CM

## 2019-06-14 DIAGNOSIS — C50211 Malignant neoplasm of upper-inner quadrant of right female breast: Secondary | ICD-10-CM

## 2019-06-14 NOTE — Telephone Encounter (Signed)
Called [facility].  Pharmacist request new order.  January 2020 order shows cancelled in their system order reports. Refills x 2 sent to last through next scheduled F/U Thursday 11-05-2019.

## 2019-11-04 NOTE — Progress Notes (Signed)
Patient Care Team: Mayra Neer, MD as PCP - General (Family Medicine) Nicholas Lose, MD as Consulting Physician (Hematology and Oncology) Jovita Kussmaul, MD as Consulting Physician (General Surgery) Arloa Koh, MD (Inactive) as Consulting Physician (Radiation Oncology) Sylvan Cheese, NP as Nurse Practitioner (Hematology and Oncology)  DIAGNOSIS:    ICD-10-CM   1. Malignant neoplasm of upper-inner quadrant of right breast in female, estrogen receptor positive ([place])  C50.211    Z17.0     SUMMARY OF ONCOLOGIC HISTORY: Oncology History  Breast cancer of upper-inner quadrant of right female breast ([place])  02/03/2015 Mammogram   Right breast: possible abnormality requiring further imaging   02/06/2015 Breast US   Right breast: no correlate seen   02/20/2015 Initial Biopsy   Right breast core needle bx: Invasive ductal carcinoma, grade 1, with DCIS; ER+ (99%), PR+ (98%), HER2/neu negative (ratio 1.71), Ki67 12%.   02/25/2015 Breast MRI   Right breast: 1.3 x 0.6 x 0.6 cm bilobed area of nodularity in the right breast, post biopsy change   02/25/2015 Clinical Stage   Stage IA: T1c N0   03/19/2015 Definitive Surgery   Right lumpectomy / [facility] Marlou Starks): IDC 1.7 cm with DCIS, micromet in one lymph node measuring 2 mm negative for ECS, other lymph node negative;  ER+ (99%), PR+ (98%), HER-2 negative, Ki-67 12%   03/19/2015 Pathologic Stage   Stage IIA: pT1c pN1a    03/19/2015 Procedure   Mammaprint low risk luminal A   05/05/2015 - 06/05/2015 Radiation Therapy   Adjuvant RT Valere Dross): Right breast 48 Gy over 24 fractions    06/19/2015 -  Anti-estrogen oral therapy   Anastrozole 1 mg daily. Switched to letrozole 08/05/2016 due to trigger fingers   09/11/2015 Survivorship   Survivorship visit completed and copy of care plan provided to patient   05/15/2018 Surgery   Left total hip arthroplasty     CHIEF COMPLIANT: Follow-up of right breast cancer on letrozole  therapy  INTERVAL HISTORY: [name] is a 79 y.o. with above-mentioned history of right breast cancer treated with lumpectomy, radiation, and who is currently on antiestrogen therapy with letrozole. Mammogram on 02/23/19 showed an indeterminate right breast mass at the 11 o'clock position. Biopsy on 02/27/19 showed no evidence of malignancy with fibrosis, fat necrosis, and calcifications. She presents to the clinic today for annual follow-up.  She is once again complaining of changes in the scar tissue of the right breast.  It feels much more harder and thicker.  She is worried about recurrence of breast cancer.   ALLERGIES:  is allergic to statins.  MEDICATIONS:  Current Outpatient Medications  Medication Sig Dispense Refill  . amoxicillin (AMOXIL) 500 MG capsule Take 4 pills one hour prior to dental procedure 12 capsule 1  . aspirin EC 81 MG tablet Take 1 tablet (81 mg total) by mouth daily. 84 tablet 0  . bimatoprost (LUMIGAN) 0.01 % SOLN Place 1 drop into both eyes at bedtime.     . Calcium-Vitamin D (CALTRATE 600 PLUS-VIT D PO) Take 2 tablets by mouth daily with supper.     . dorzolamide-timolol (COSOPT) 22.3-6.8 MG/ML ophthalmic solution Place 1 drop into both eyes 2 (two) times daily.     Marland Kitchen ezetimibe (ZETIA) 10 MG tablet Take 10 mg by mouth daily with supper.     Marland Kitchen letrozole (FEMARA) 2.5 MG tablet TAKE 1 TABLET BY MOUTH EVERY DAY 90 tablet 1  . levothyroxine (SYNTHROID, LEVOTHROID) 100 MCG tablet Take 100 mcg  by mouth daily before breakfast.    . multivitamin (THERAGRAN) per tablet Take 1 tablet by mouth daily.     . Omega-3 Fatty Acids (FISH OIL OMEGA-3) 1000 MG CAPS Take 2 tablets by mouth 2 (two) times daily. 60 capsule   . polyethylene glycol (MIRALAX / GLYCOLAX) packet Take 17 g by mouth daily as needed for mild constipation.     . senna-docusate (SENOKOT S) 8.6-50 MG tablet Take 1 tablet by mouth at bedtime as needed. 30 tablet 1   No current facility-administered medications  for this visit.    PHYSICAL EXAMINATION: ECOG PERFORMANCE STATUS: 1 - Symptomatic but completely ambulatory  Vitals:   11/05/19 1023  BP: (!) 141/58  Pulse: 76  Resp: 17  Temp: 98.4 F (36.9 C)  SpO2: 98%   Filed Weights   11/05/19 1023  Weight: 191 lb 4.8 oz (86.8 kg)    BREAST: No palpable masses or nodules in either right or left breasts. No palpable axillary supraclavicular or infraclavicular adenopathy no breast tenderness or nipple discharge. (exam performed in the presence of a chaperone)  LABORATORY DATA:  I have reviewed the data as listed CMP Latest Ref Rng & Units 05/16/2018 05/04/2018 05/24/2013  Glucose 70 - 99 mg/dL 161(H) 115(H) 143(H)  BUN 8 - 23 mg/dL '13 13 22  ' Creatinine 0.44 - 1.00 mg/dL 0.72 0.76 0.83  Sodium 135 - 145 mmol/L 138 141 139  Potassium 3.5 - 5.1 mmol/L 3.5 3.8 3.6  Chloride 98 - 111 mmol/L 107 106 105  CO2 22 - 32 mmol/L '23 26 27  ' Calcium 8.9 - 10.3 mg/dL 8.3(L) 9.3 8.8  Total Protein 6.5 - 8.1 g/dL - 6.8 -  Total Bilirubin 0.3 - 1.2 mg/dL - 1.1 -  Alkaline Phos 38 - 126 U/L - 74 -  AST 15 - 41 U/L - 21 -  ALT 0 - 44 U/L - 16 -    Lab Results  Component Value Date   WBC 10.7 (H) 05/16/2018   HGB 10.1 (L) 05/16/2018   HCT 30.0 (L) 05/16/2018   MCV 96.8 05/16/2018   PLT 163 05/16/2018   NEUTROABS 3.0 05/04/2018    ASSESSMENT & PLAN:  Breast cancer of upper-inner quadrant of right female breast Right lumpectomy 03/19/2015: IDC 1.7 cm with DCIS, micromet in one lymph node measuring 2 mm negative for ECS, other lymph node negative, T1cN1a stage II a, ER 99%, PR 98%, HER-2 negative, Ki-67 12%, Mammaprint low-risk luminal A, status post adjuvant radiation, started anastrozole 1 mg daily 08/24/2016changed to Letrozole 08/05/16  Letrozole toxicities: Arthralgias myalgias: Much better Treatment duration between 5 to 7 years.  Worsening palpable nodularity in the right breast 11 o'clock position: We will obtain a mammogram and ultrasound  of that area.   Breast cancer surveillance: 1. Breast exam1/08/2020: Palpable nodularity in the right breast scar tissue.  It is difficult to assess if it is benign or if there is any concern and therefore we will obtain a mammogram and ultrasound of this area. 2. Annual mammograms  02/23/2019: Interval development of a small lobular mass 5 x 8 mm right breast 7 o'clock position, ultrasound-guided biopsy: Fibrosis and fat necrosis no malignancy identified   Return to clinic in 1 year for follow-up    No orders of the defined types were placed in this encounter.  The patient has a good understanding of the overall plan. she agrees with it. she will call with any problems that may develop before the next visit  here.  Total time spent: 15 mins including face to face time and time spent for planning, charting and coordination of care  Nicholas Lose, MD 11/05/2019  I, Cloyde Reams Dorshimer, am acting as scribe for Dr. Nicholas Lose.  I have reviewed the above documentation for accuracy and completeness, and I agree with the above.

## 2019-11-05 ENCOUNTER — Telehealth: Payer: Self-pay | Admitting: Hematology and Oncology

## 2019-11-05 ENCOUNTER — Other Ambulatory Visit: Payer: Self-pay

## 2019-11-05 ENCOUNTER — Inpatient Hospital Stay: Payer: Medicare Other | Attending: Hematology and Oncology | Admitting: Hematology and Oncology

## 2019-11-05 DIAGNOSIS — Z170 Estrogen receptor positive status [ER+]: Secondary | ICD-10-CM | POA: Insufficient documentation

## 2019-11-05 DIAGNOSIS — C50211 Malignant neoplasm of upper-inner quadrant of right female breast: Secondary | ICD-10-CM | POA: Diagnosis not present

## 2019-11-05 DIAGNOSIS — Z79811 Long term (current) use of aromatase inhibitors: Secondary | ICD-10-CM | POA: Diagnosis not present

## 2019-11-05 MED ORDER — LETROZOLE 2.5 MG PO TABS: 2.5000 mg | ORAL_TABLET | Freq: Every day | ORAL | 3 refills | Status: DC

## 2019-11-05 NOTE — Telephone Encounter (Signed)
I left a message regarding schedule  

## 2019-11-05 NOTE — Assessment & Plan Note (Signed)
Right lumpectomy 03/19/2015: IDC 1.7 cm with DCIS, micromet in one lymph node measuring 2 mm negative for ECS, other lymph node negative, T1cN1a stage II a, ER 99%, PR 98%, HER-2 negative, Ki-67 12%, Mammaprint low-risk luminal A, status post adjuvant radiation, started anastrozole 1 mg daily 08/24/2016changed to Letrozole 08/05/16  Letrozole toxicities: Arthralgias myalgias: Much better  Will order bone density scan. Treatment duration between 5 to 7 years.  Breast cancer surveillance: 1. Breast exam1/08/2020: Benign 2. Annual mammograms  02/23/2019: Interval development of a small lobular mass 5 x 8 mm right breast 7 o'clock position, ultrasound-guided biopsy: Fibrosis and fat necrosis no malignancy identified  Return to clinic in 1 year for follow-up

## 2019-11-21 ENCOUNTER — Ambulatory Visit: Payer: Medicare Other

## 2019-11-21 ENCOUNTER — Other Ambulatory Visit: Payer: Self-pay

## 2019-11-21 ENCOUNTER — Ambulatory Visit
Admission: RE | Admit: 2019-11-21 | Discharge: 2019-11-21 | Disposition: A | Discharge status: Home / Self Care | Payer: Medicare Other | Source: Ambulatory Visit | Attending: Hematology and Oncology | Admitting: Hematology and Oncology

## 2019-11-21 DIAGNOSIS — Z170 Estrogen receptor positive status [ER+]: Secondary | ICD-10-CM

## 2019-11-21 DIAGNOSIS — C50211 Malignant neoplasm of upper-inner quadrant of right female breast: Secondary | ICD-10-CM

## 2019-12-10 ENCOUNTER — Other Ambulatory Visit: Payer: Self-pay | Admitting: Family Medicine

## 2019-12-10 ENCOUNTER — Ambulatory Visit
Admission: RE | Admit: 2019-12-10 | Discharge: 2019-12-10 | Disposition: A | Discharge status: Home / Self Care | Payer: Medicare Other | Source: Ambulatory Visit | Attending: Family Medicine | Admitting: Family Medicine

## 2019-12-10 DIAGNOSIS — M545 Low back pain, unspecified: Secondary | ICD-10-CM

## 2020-01-28 ENCOUNTER — Other Ambulatory Visit: Payer: Self-pay | Admitting: Hematology and Oncology

## 2020-01-28 DIAGNOSIS — Z9889 Other specified postprocedural states: Secondary | ICD-10-CM

## 2020-02-05 ENCOUNTER — Telehealth: Payer: Self-pay

## 2020-02-05 NOTE — Telephone Encounter (Signed)
FYI   Patients dentist office called and asked if pt still needs to be premedicated. Per protocol sheet dr. Erlinda Hong likes them to do premeds till they are two years postop. They were informed this and stated understanding.

## 2020-02-29 ENCOUNTER — Other Ambulatory Visit: Payer: Self-pay

## 2020-02-29 ENCOUNTER — Ambulatory Visit
Admission: RE | Admit: 2020-02-29 | Discharge: 2020-02-29 | Disposition: A | Discharge status: Home / Self Care | Payer: Medicare Other | Source: Ambulatory Visit | Attending: Hematology and Oncology | Admitting: Hematology and Oncology

## 2020-02-29 DIAGNOSIS — Z9889 Other specified postprocedural states: Secondary | ICD-10-CM

## 2020-05-13 ENCOUNTER — Ambulatory Visit: Payer: Medicare Other | Admitting: Orthopaedic Surgery

## 2020-05-14 ENCOUNTER — Ambulatory Visit: Payer: Medicare Other | Admitting: Orthopaedic Surgery

## 2020-05-14 ENCOUNTER — Ambulatory Visit: Payer: Self-pay

## 2020-05-14 ENCOUNTER — Encounter: Payer: Self-pay | Admitting: Orthopaedic Surgery

## 2020-05-14 VITALS — Ht 67.0 in | Wt 174.0 lb

## 2020-05-14 DIAGNOSIS — Z96642 Presence of left artificial hip joint: Secondary | ICD-10-CM

## 2020-05-14 NOTE — Progress Notes (Signed)
Office Visit Note   Patient: [name]           Date of Birth: 07-24-1941           MRN: [id number] Visit Date: 05/14/2020              Requested by: Mayra Neer, MD [address] [place],  [place] [address] PCP: Mayra Neer, MD   Assessment & Plan: Visit Diagnoses:  1. History of left hip replacement     Plan: Impression is 2 years status post left anterior total hip replacement doing well.  Patient will continue with activity as tolerated.  Follow-up with [place] in 1 to 2 years time for recheck and AP pelvis lateral hip x-rays..  Call with concerns or questions.  Follow-Up Instructions: Return in about 1 year (around 05/14/2021).   Orders:  Orders Placed This Encounter  Procedures   XR HIP UNILAT W OR W/O PELVIS 2-3 VIEWS LEFT   No orders of the defined types were placed in this encounter.     Procedures: No procedures performed   Clinical Data: No additional findings.   Subjective: Chief Complaint  Patient presents with   Left Hip - Follow-up    05/15/18 Left THA    HPI patient is a pleasant 79 year old female who comes in today 2 years out left anterior total hip replacement 05/15/2018.  She has been doing well.  No complaints other than occasionally feeling uncomfortable placed on her left hip while laying on the left side.  Review of Systems as detailed in HPI.  All others reviewed and are negative.   Objective: Vital Signs: Ht 5\' 7"  (1.702 m)    Wt 174 lb (78.9 kg)    BMI 27.25 kg/m   Physical Exam well-developed well-nourished female no acute distress.  Alert oriented x3.  Ortho Exam examination of her left hip reveals full range of motion strength.  She is neurovascular intact distally.  Specialty Comments:  No specialty comments available.  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 ,Grade 1, ER/PR +,  s/p  right mastectomy w/ node dissection 03-19-2015 and Adjuvant RXT (05-05-2015 to 06-05-2015)/  currently anit-estrogen therapy w/ anastrozole   External hemorrhoid, bleeding    First degree heart block    Glaucoma, both eyes    Headache    History of palpitations    Hypothyroidism, postradioiodine therapy    DUE TO GOITER   Internal hemorrhoid, bleeding    Personal history of radiation therapy 2016   Primary localized osteoarthritis of left hip    S/P radiation therapy 05/05/2015 through 06/05/2015   Right breast (high tangents) 4800 cGy in 24 sessions , no boost     Wears glasses    Wears hearing aid    bilateral    Family History  Adopted: Yes    Past Surgical History:  Procedure Laterality Date   ANTERIOR AND POSTERIOR REPAIR  06/10/2011  Procedure: ANTERIOR (CYSTOCELE) AND POSTERIOR REPAIR (RECTOCELE);  Surgeon: Blane Ohara Meisinger;  Location: [facility];  Service: Gynecology;  Laterality: N/A;   APPENDECTOMY  as child   BREAST BIOPSY Right 02/19/2017   BREAST BIOPSY Right 02/27/2019   fibrosis, fat necrosis, chronic inflamation    BREAST LUMPECTOMY Right 02/2015   BREAST SURGERY Right 03/19/15   lumpectomy/2 nodes   CARPAL TUNNEL RELEASE Left    CHOLECYSTECTOMY OPEN  1974   COLONOSCOPY W/ BIOPSIES AND POLYPECTOMY     EVALUATION UNDER ANESTHESIA WITH HEMORRHOIDECTOMY N/A 01/30/2016   Procedure: EXAM UNDER ANESTHESIA POSSIBLE HEMORRHOIDAL PEXY SINGLE COLUMN  HEMORRHOIDECTOMY;  Surgeon: [name], MD;  Location: [facility];  Service:  General;  Laterality: N/A;   HAMMER TOE SURGERY Left    KNEE ARTHROSCOPY Right    ORIF RIGHT ANKLE FX  1999   RADIOACTIVE SEED GUIDED PARTIAL MASTECTOMY WITH AXILLARY SENTINEL LYMPH NODE BIOPSY Right 03/19/2015   Procedure: RIGHT BREAST LUMPECTOMY WITH RADIOACTIVE SEED AND SENTINEL LYMPH NODE MAPPING;  Surgeon: Autumn Messing III, MD;  Location: [facility];  Service: General;  Laterality: Right;   TONSILLECTOMY  as child   TOTAL HIP ARTHROPLASTY Left 05/15/2018   Procedure: LEFT TOTAL HIP ARTHROPLASTY ANTERIOR APPROACH;  Surgeon: Leandrew Koyanagi, MD;  Location: [facility];  Service: Orthopedics;  Laterality: Left;   TOTAL KNEE ARTHROPLASTY Right 05/23/2013   Procedure: Right Total Knee Arthroplasty;  Surgeon: Newt Minion, MD;  Location: [facility];  Service: Orthopedics;  Laterality: Right;  Right Total Knee Arthroplasty   TUBAL LIGATION     VAGINAL HYSTERECTOMY  07-07-2010   w/  Anterior & Posterior Repair and Solyx mid-urethral sling   VAGINAL PROLAPSE REPAIR  06/10/2011   Procedure: VAGINAL VAULT SUSPENSION;  Surgeon: Blane Ohara Meisinger;  Location: [facility];  Service: Gynecology;  Laterality: N/A;   Social History   Occupational History   Not on file  Tobacco Use   Smoking status: Never Smoker   Smokeless tobacco: Never Used  Vaping Use   Vaping Use: Never used  Substance and Sexual Activity   Alcohol use: No   Drug use: No   Sexual activity: Not on file

## 2020-08-14 ENCOUNTER — Other Ambulatory Visit: Payer: Self-pay | Admitting: Family Medicine

## 2020-08-14 DIAGNOSIS — M8580 Other specified disorders of bone density and structure, unspecified site: Secondary | ICD-10-CM

## 2020-11-03 NOTE — Progress Notes (Signed)
Patient Care Team: Lupita Raider, MD as PCP - General (Family Medicine) Serena Croissant, MD as Consulting Physician (Hematology and Oncology) Griselda Miner, MD as Consulting Physician (General Surgery) Chipper Herb, MD (Inactive) as Consulting Physician (Radiation Oncology) Salomon Fick, NP as Nurse Practitioner (Hematology and Oncology)  DIAGNOSIS:    ICD-10-CM   1. Malignant neoplasm of upper-inner quadrant of right breast in female, estrogen receptor positive (HCC)  C50.211    Z17.0     SUMMARY OF ONCOLOGIC HISTORY: Oncology History  Breast cancer of upper-inner quadrant of right female breast (HCC)  02/03/2015 Mammogram   Right breast: possible abnormality requiring further imaging   02/06/2015 Breast US   Right breast: no correlate seen   02/20/2015 Initial Biopsy   Right breast core needle bx: Invasive ductal carcinoma, grade 1, with DCIS; ER+ (99%), PR+ (98%), HER2/neu negative (ratio 1.71), Ki67 12%.   02/25/2015 Breast MRI   Right breast: 1.3 x 0.6 x 0.6 cm bilobed area of nodularity in the right breast, post biopsy change   02/25/2015 Clinical Stage   Stage IA: T1c N0   03/19/2015 Definitive Surgery   Right lumpectomy / [facility]NB Carolynne Edouard): IDC 1.7 cm with DCIS, micromet in one lymph node measuring 2 mm negative for ECS, other lymph node negative;  ER+ (99%), PR+ (98%), HER-2 negative, Ki-67 12%   03/19/2015 Pathologic Stage   Stage IIA: pT1c pN1a    03/19/2015 Procedure   Mammaprint low risk luminal A   05/05/2015 - 06/05/2015 Radiation Therapy   Adjuvant RT Dayton Scrape): Right breast 48 Gy over 24 fractions    06/19/2015 -  Anti-estrogen oral therapy   Anastrozole 1 mg daily. Switched to letrozole 08/05/2016 due to trigger fingers   09/11/2015 Survivorship   Survivorship visit completed and copy of care plan provided to patient   05/15/2018 Surgery   Left total hip arthroplasty     CHIEF COMPLIANT: Follow-up of right breast cancer on letrozole  therapy  INTERVAL HISTORY: [name] is a 80 y.o. with above-mentioned history of lumpectomy, radiation, and whois currently on antiestrogen therapy with letrozole. Mammogram on 02/23/19 showed no evidence of malignancy bilaterally. She presents to the clinic today for annual follow-up.  ALLERGIES:  is allergic to statins.  MEDICATIONS:  Current Outpatient Medications  Medication Sig Dispense Refill  . amoxicillin (AMOXIL) 500 MG capsule Take 4 pills one hour prior to dental procedure 12 capsule 1  . aspirin EC 81 MG tablet Take 1 tablet (81 mg total) by mouth daily. 84 tablet 0  . bimatoprost (LUMIGAN) 0.01 % SOLN Place 1 drop into both eyes at bedtime.     . Calcium-Vitamin D (CALTRATE 600 PLUS-VIT D PO) Take 2 tablets by mouth daily with supper.     . dorzolamide-timolol (COSOPT) 22.3-6.8 MG/ML ophthalmic solution Place 1 drop into both eyes 2 (two) times daily.     Marland Kitchen ezetimibe (ZETIA) 10 MG tablet Take 10 mg by mouth daily with supper.     Marland Kitchen letrozole (FEMARA) 2.5 MG tablet Take 1 tablet (2.5 mg total) by mouth daily. 90 tablet 3  . levothyroxine (SYNTHROID, LEVOTHROID) 100 MCG tablet Take 100 mcg by mouth daily before breakfast.    . multivitamin (THERAGRAN) per tablet Take 1 tablet by mouth daily.     . Omega-3 Fatty Acids (FISH OIL OMEGA-3) 1000 MG CAPS Take 2 tablets by mouth 2 (two) times daily. 60 capsule   . polyethylene glycol (MIRALAX / GLYCOLAX) packet Take 17 g by  mouth daily as needed for mild constipation.     . senna-docusate (SENOKOT S) 8.6-50 MG tablet Take 1 tablet by mouth at bedtime as needed. 30 tablet 1   No current facility-administered medications for this visit.    PHYSICAL EXAMINATION: ECOG PERFORMANCE STATUS: 1 - Symptomatic but completely ambulatory  Vitals:   11/04/20 1005  BP: (!) 140/59  Pulse: 69  Resp: 17  Temp: 97.7 F (36.5 C)  SpO2: 100%   Filed Weights   11/04/20 1005  Weight: 174 lb 6.4 oz (79.1 kg)    BREAST: No palpable  masses or nodules in either right or left breasts. No palpable axillary supraclavicular or infraclavicular adenopathy no breast tenderness or nipple discharge. (exam performed in the presence of a chaperone)  LABORATORY DATA:  I have reviewed the data as listed CMP Latest Ref Rng & Units 05/16/2018 05/04/2018 05/24/2013  Glucose 70 - 99 mg/dL 161(W) 960(A) 540(J)  BUN 8 - 23 mg/dL 13 13 22   Creatinine 0.44 - 1.00 mg/dL 8.11 9.14 7.82  Sodium 135 - 145 mmol/L 138 141 139  Potassium 3.5 - 5.1 mmol/L 3.5 3.8 3.6  Chloride 98 - 111 mmol/L 107 106 105  CO2 22 - 32 mmol/L 23 26 27   Calcium 8.9 - 10.3 mg/dL 8.3(L) 9.3 8.8  Total Protein 6.5 - 8.1 g/dL - 6.8 -  Total Bilirubin 0.3 - 1.2 mg/dL - 1.1 -  Alkaline Phos 38 - 126 U/L - 74 -  AST 15 - 41 U/L - 21 -  ALT 0 - 44 U/L - 16 -    Lab Results  Component Value Date   WBC 10.7 (H) 05/16/2018   HGB 10.1 (L) 05/16/2018   HCT 30.0 (L) 05/16/2018   MCV 96.8 05/16/2018   PLT 163 05/16/2018   NEUTROABS 3.0 05/04/2018    ASSESSMENT & PLAN:  Breast cancer of upper-inner quadrant of right female breast Right lumpectomy 03/19/2015: IDC 1.7 cm with DCIS, micromet in one lymph node measuring 2 mm negative for ECS, other lymph node negative, T1cN1a stage II a, ER 99%, PR 98%, HER-2 negative, Ki-67 12%, Mammaprint low-risk luminal A, status post adjuvant radiation, started anastrozole 1 mg daily 08/24/2016changed to Letrozole 08/05/16  Letrozole toxicities: Arthralgias myalgias: Much better Treatment duration between 7 years.  Breast cancer surveillance: 1. Breast exam1/08/2021:  Benign 2. Annual mammograms  02/29/2020:  Benign breast density category B 02/27/2019: Right breast lobular mass: Biopsy: Fibrosis and fat necrosis Bone density scheduled in February  Return to clinic in 1 year for follow-up   No orders of the defined types were placed in this encounter.  The patient has a good understanding of the overall plan. she agrees with  it. she will call with any problems that may develop before the next visit here.  Total time spent: 20 mins including face to face time and time spent for planning, charting and coordination of care  Serena Croissant, MD 11/04/2020  I, Kirt Boys Dorshimer, am acting as scribe for Dr. Serena Croissant.  I have reviewed the above documentation for accuracy and completeness, and I agree with the above.

## 2020-11-04 ENCOUNTER — Inpatient Hospital Stay: Payer: Medicare Other | Attending: Hematology and Oncology | Admitting: Hematology and Oncology

## 2020-11-04 ENCOUNTER — Other Ambulatory Visit: Payer: Self-pay

## 2020-11-04 DIAGNOSIS — C50211 Malignant neoplasm of upper-inner quadrant of right female breast: Secondary | ICD-10-CM | POA: Diagnosis present

## 2020-11-04 DIAGNOSIS — Z79899 Other long term (current) drug therapy: Secondary | ICD-10-CM | POA: Diagnosis not present

## 2020-11-04 DIAGNOSIS — Z170 Estrogen receptor positive status [ER+]: Secondary | ICD-10-CM | POA: Diagnosis not present

## 2020-11-04 DIAGNOSIS — Z923 Personal history of irradiation: Secondary | ICD-10-CM | POA: Diagnosis not present

## 2020-11-04 DIAGNOSIS — Z79811 Long term (current) use of aromatase inhibitors: Secondary | ICD-10-CM | POA: Insufficient documentation

## 2020-11-04 MED ORDER — LETROZOLE 2.5 MG PO TABS: 2.5000 mg | ORAL_TABLET | Freq: Every day | ORAL | 3 refills | Status: DC

## 2020-11-04 NOTE — Assessment & Plan Note (Signed)
Right lumpectomy 03/19/2015: IDC 1.7 cm with DCIS, micromet in one lymph node measuring 2 mm negative for ECS, other lymph node negative, T1cN1a stage II a, ER 99%, PR 98%, HER-2 negative, Ki-67 12%, Mammaprint low-risk luminal A, status post adjuvant radiation, started anastrozole 1 mg daily 08/24/2016changed to Letrozole 08/05/16  Letrozole toxicities: Arthralgias myalgias: Much better Treatment duration between 7 years.  Breast cancer surveillance: 1. Breast exam1/08/2021:  Benign 2. Annual mammograms  02/29/2020:  Benign breast density category B 02/27/2019: Right breast lobular mass: Biopsy: Fibrosis and fat necrosis Bone density scheduled in February  Return to clinic in 1 year for follow-up

## 2020-12-01 ENCOUNTER — Ambulatory Visit
Admission: RE | Admit: 2020-12-01 | Discharge: 2020-12-01 | Disposition: A | Discharge status: Home / Self Care | Payer: Medicare Other | Source: Ambulatory Visit | Attending: Family Medicine | Admitting: Family Medicine

## 2020-12-01 ENCOUNTER — Other Ambulatory Visit: Payer: Self-pay

## 2020-12-01 DIAGNOSIS — M8580 Other specified disorders of bone density and structure, unspecified site: Secondary | ICD-10-CM

## 2021-01-15 ENCOUNTER — Other Ambulatory Visit: Payer: Self-pay | Admitting: Hematology and Oncology

## 2021-01-15 DIAGNOSIS — Z9889 Other specified postprocedural states: Secondary | ICD-10-CM

## 2021-04-20 ENCOUNTER — Ambulatory Visit
Admission: RE | Admit: 2021-04-20 | Discharge: 2021-04-20 | Disposition: A | Discharge status: Home / Self Care | Payer: Medicare Other | Source: Ambulatory Visit | Attending: Hematology and Oncology | Admitting: Hematology and Oncology

## 2021-04-20 ENCOUNTER — Other Ambulatory Visit: Payer: Self-pay

## 2021-04-20 DIAGNOSIS — Z9889 Other specified postprocedural states: Secondary | ICD-10-CM

## 2021-11-06 NOTE — Progress Notes (Signed)
Patient Care Team: Mayra Neer, MD as PCP - General (Family Medicine) Nicholas Lose, MD as Consulting Physician (Hematology and Oncology) Jovita Kussmaul, MD as Consulting Physician (General Surgery) Arloa Koh, MD (Inactive) as Consulting Physician (Radiation Oncology) Sylvan Cheese, NP as Nurse Practitioner (Hematology and Oncology)  DIAGNOSIS:    ICD-10-CM   1. Malignant neoplasm of upper-inner quadrant of right breast in female, estrogen receptor positive ([place])  C50.211    Z17.0       SUMMARY OF ONCOLOGIC HISTORY: Oncology History  Breast cancer of upper-inner quadrant of right female breast ([place])  02/03/2015 Mammogram   Right breast: possible abnormality requiring further imaging   02/06/2015 Breast US   Right breast: no correlate seen   02/20/2015 Initial Biopsy   Right breast core needle bx: Invasive ductal carcinoma, grade 1, with DCIS; ER+ (99%), PR+ (98%), HER2/neu negative (ratio 1.71), Ki67 12%.   02/25/2015 Breast MRI   Right breast: 1.3 x 0.6 x 0.6 cm bilobed area of nodularity in the right breast, post biopsy change   02/25/2015 Clinical Stage   Stage IA: T1c N0   03/19/2015 Definitive Surgery   Right lumpectomy / [facility] Marlou Starks): IDC 1.7 cm with DCIS, micromet in one lymph node measuring 2 mm negative for ECS, other lymph node negative;  ER+ (99%), PR+ (98%), HER-2 negative, Ki-67 12%   03/19/2015 Pathologic Stage   Stage IIA: pT1c pN1a    03/19/2015 Procedure   Mammaprint low risk luminal A   05/05/2015 - 06/05/2015 Radiation Therapy   Adjuvant RT Valere Dross): Right breast 48 Gy over 24 fractions    06/19/2015 -  Anti-estrogen oral therapy   Anastrozole 1 mg daily. Switched to letrozole 08/05/2016 due to trigger fingers   09/11/2015 Survivorship   Survivorship visit completed and copy of care plan provided to patient   05/15/2018 Surgery   Left total hip arthroplasty     CHIEF COMPLIANT: Follow-up of right breast cancer on letrozole  therapy  INTERVAL HISTORY: [name] is a 81 y.o. with above-mentioned history of right breast cancer lumpectomy, radiation, and who is currently on antiestrogen therapy with letrozole. Mammogram on 04/20/2021 showed no evidence of malignancy bilaterally. She presents to the clinic today for annual follow-up.  She denies any pain or discomfort in the breast.  She denies any hot flashes or arthralgias or myalgias.  Overall her health has been in good shape.  ALLERGIES:  is allergic to statins.  MEDICATIONS:  Current Outpatient Medications  Medication Sig Dispense Refill   aspirin EC 81 MG tablet Take 1 tablet (81 mg total) by mouth daily. 84 tablet 0   bimatoprost (LUMIGAN) 0.01 % SOLN Place 1 drop into both eyes at bedtime.      Calcium-Vitamin D (CALTRATE 600 PLUS-VIT D PO) Take 2 tablets by mouth daily with supper.      dorzolamide-timolol (COSOPT) 22.3-6.8 MG/ML ophthalmic solution Place 1 drop into both eyes 2 (two) times daily.      ezetimibe (ZETIA) 10 MG tablet Take 10 mg by mouth daily with supper.      letrozole (FEMARA) 2.5 MG tablet Take 1 tablet (2.5 mg total) by mouth daily. 90 tablet 3   levothyroxine (SYNTHROID, LEVOTHROID) 100 MCG tablet Take 100 mcg by mouth daily before breakfast.     multivitamin (THERAGRAN) per tablet Take 1 tablet by mouth daily.      Omega-3 Fatty Acids (FISH OIL OMEGA-3) 1000 MG CAPS Take 2 tablets by mouth 2 (two) times  daily. 60 capsule    polyethylene glycol (MIRALAX / GLYCOLAX) packet Take 17 g by mouth daily as needed for mild constipation.      senna-docusate (SENOKOT S) 8.6-50 MG tablet Take 1 tablet by mouth at bedtime as needed. 30 tablet 1   No current facility-administered medications for this visit.    PHYSICAL EXAMINATION: ECOG PERFORMANCE STATUS: 1 - Symptomatic but completely ambulatory  Vitals:   11/09/21 1102  BP: (!) 154/57  Pulse: 60  Resp: 18  Temp: (!) 97.3 F (36.3 C)  SpO2: 100%   Filed Weights   11/09/21 1102   Weight: 180 lb 9.6 oz (81.9 kg)    BREAST: No palpable masses or nodules in either right or left breasts. No palpable axillary supraclavicular or infraclavicular adenopathy no breast tenderness or nipple discharge. (exam performed in the presence of a chaperone)  LABORATORY DATA:  I have reviewed the data as listed CMP Latest Ref Rng & Units 05/16/2018 05/04/2018 05/24/2013  Glucose 70 - 99 mg/dL 161(H) 115(H) 143(H)  BUN 8 - 23 mg/dL '13 13 22  ' Creatinine 0.44 - 1.00 mg/dL 0.72 0.76 0.83  Sodium 135 - 145 mmol/L 138 141 139  Potassium 3.5 - 5.1 mmol/L 3.5 3.8 3.6  Chloride 98 - 111 mmol/L 107 106 105  CO2 22 - 32 mmol/L '23 26 27  ' Calcium 8.9 - 10.3 mg/dL 8.3(L) 9.3 8.8  Total Protein 6.5 - 8.1 g/dL - 6.8 -  Total Bilirubin 0.3 - 1.2 mg/dL - 1.1 -  Alkaline Phos 38 - 126 U/L - 74 -  AST 15 - 41 U/L - 21 -  ALT 0 - 44 U/L - 16 -    Lab Results  Component Value Date   WBC 10.7 (H) 05/16/2018   HGB 10.1 (L) 05/16/2018   HCT 30.0 (L) 05/16/2018   MCV 96.8 05/16/2018   PLT 163 05/16/2018   NEUTROABS 3.0 05/04/2018    ASSESSMENT & PLAN:  Breast cancer of upper-inner quadrant of right female breast Right lumpectomy 03/19/2015: IDC 1.7 cm with DCIS, micromet in one lymph node measuring 2 mm negative for ECS, other lymph node negative, T1cN1a stage II a, ER 99%, PR 98%, HER-2 negative, Ki-67 12%, Mammaprint low-risk luminal A, status post adjuvant radiation, started anastrozole 1 mg daily 06/18/2015 changed to Letrozole 08/05/16   Letrozole toxicities: Denies any arthralgias or myalgias She will finish 7 years of antiestrogen therapy by August 2023.   Breast cancer surveillance: 1. Breast exam 11/09/2021:  Benign 2. Annual mammograms  04/20/2021:  Benign breast density category B 02/27/2019: Right breast lobular mass: Biopsy: Fibrosis and fat necrosis Bone density 12/01/2020: T score -1.3   Return to clinic in 1 year for follow-up with long-term survivorship clinic    No orders of  the defined types were placed in this encounter.  The patient has a good understanding of the overall plan. she agrees with it. she will call with any problems that may develop before the next visit here.  Total time spent: 20 mins including face to face time and time spent for planning, charting and coordination of care  Rulon Eisenmenger, MD, MPH 11/09/2021  I, Thana Ates, am acting as scribe for Dr. Nicholas Lose.  I have reviewed the above documentation for accuracy and completeness, and I agree with the above.

## 2021-11-08 NOTE — Assessment & Plan Note (Signed)
Right lumpectomy 03/19/2015: IDC 1.7 cm with DCIS, micromet in one lymph node measuring 2 mm negative for ECS, other lymph node negative, T1cN1a stage II a, ER 99%, PR 98%, HER-2 negative, Ki-67 12%, Mammaprint low-risk luminal A, status post adjuvant radiation, started anastrozole 1 mg daily 08/24/2016changed to Letrozole 08/05/16  Letrozole toxicities: Arthralgias myalgias: Much better Treatment duration between 7 years.  Breast cancer surveillance: 1. Breast exam1/16/2023: Benign 2. Annual mammograms6/27/2022: Benign breast density category B 02/27/2019: Right breast lobular mass: Biopsy: Fibrosis and fat necrosis Bone density scheduled in February  Return to clinic in 1 year for follow-up

## 2021-11-09 ENCOUNTER — Inpatient Hospital Stay: Payer: Medicare Other | Attending: Hematology and Oncology | Admitting: Hematology and Oncology

## 2021-11-09 ENCOUNTER — Other Ambulatory Visit: Payer: Self-pay

## 2021-11-09 DIAGNOSIS — Z170 Estrogen receptor positive status [ER+]: Secondary | ICD-10-CM | POA: Diagnosis not present

## 2021-11-09 DIAGNOSIS — Z79811 Long term (current) use of aromatase inhibitors: Secondary | ICD-10-CM | POA: Diagnosis not present

## 2021-11-09 DIAGNOSIS — Z923 Personal history of irradiation: Secondary | ICD-10-CM | POA: Insufficient documentation

## 2021-11-09 DIAGNOSIS — C50211 Malignant neoplasm of upper-inner quadrant of right female breast: Secondary | ICD-10-CM | POA: Insufficient documentation

## 2021-11-09 MED ORDER — LETROZOLE 2.5 MG PO TABS: 2.5000 mg | ORAL_TABLET | Freq: Every day | ORAL | 1 refills | Status: DC

## 2021-12-22 DIAGNOSIS — H401112 Primary open-angle glaucoma, right eye, moderate stage: Secondary | ICD-10-CM | POA: Diagnosis not present

## 2021-12-22 DIAGNOSIS — H04123 Dry eye syndrome of bilateral lacrimal glands: Secondary | ICD-10-CM | POA: Diagnosis not present

## 2021-12-22 DIAGNOSIS — H401123 Primary open-angle glaucoma, left eye, severe stage: Secondary | ICD-10-CM | POA: Diagnosis not present

## 2022-01-25 DIAGNOSIS — E782 Mixed hyperlipidemia: Secondary | ICD-10-CM | POA: Diagnosis not present

## 2022-01-25 DIAGNOSIS — E039 Hypothyroidism, unspecified: Secondary | ICD-10-CM | POA: Diagnosis not present

## 2022-01-25 DIAGNOSIS — E1169 Type 2 diabetes mellitus with other specified complication: Secondary | ICD-10-CM | POA: Diagnosis not present

## 2022-03-08 DIAGNOSIS — E039 Hypothyroidism, unspecified: Secondary | ICD-10-CM | POA: Diagnosis not present

## 2022-03-19 ENCOUNTER — Other Ambulatory Visit: Payer: Self-pay | Admitting: Hematology and Oncology

## 2022-03-19 DIAGNOSIS — Z1231 Encounter for screening mammogram for malignant neoplasm of breast: Secondary | ICD-10-CM

## 2022-04-22 ENCOUNTER — Ambulatory Visit
Admission: RE | Admit: 2022-04-22 | Discharge: 2022-04-22 | Disposition: A | Discharge status: Home / Self Care | Payer: Medicare Other | Source: Ambulatory Visit | Attending: Hematology and Oncology | Admitting: Hematology and Oncology

## 2022-04-22 DIAGNOSIS — Z1231 Encounter for screening mammogram for malignant neoplasm of breast: Secondary | ICD-10-CM | POA: Diagnosis not present

## 2022-06-23 DIAGNOSIS — H52203 Unspecified astigmatism, bilateral: Secondary | ICD-10-CM | POA: Diagnosis not present

## 2022-06-23 DIAGNOSIS — H401123 Primary open-angle glaucoma, left eye, severe stage: Secondary | ICD-10-CM | POA: Diagnosis not present

## 2022-06-23 DIAGNOSIS — H524 Presbyopia: Secondary | ICD-10-CM | POA: Diagnosis not present

## 2022-06-23 DIAGNOSIS — H04123 Dry eye syndrome of bilateral lacrimal glands: Secondary | ICD-10-CM | POA: Diagnosis not present

## 2022-06-23 DIAGNOSIS — H43813 Vitreous degeneration, bilateral: Secondary | ICD-10-CM | POA: Diagnosis not present

## 2022-06-23 DIAGNOSIS — H35371 Puckering of macula, right eye: Secondary | ICD-10-CM | POA: Diagnosis not present

## 2022-06-23 DIAGNOSIS — H0100B Unspecified blepharitis left eye, upper and lower eyelids: Secondary | ICD-10-CM | POA: Diagnosis not present

## 2022-06-23 DIAGNOSIS — H0100A Unspecified blepharitis right eye, upper and lower eyelids: Secondary | ICD-10-CM | POA: Diagnosis not present

## 2022-08-11 DIAGNOSIS — Z0000 Encounter for general adult medical examination without abnormal findings: Secondary | ICD-10-CM | POA: Diagnosis not present

## 2022-08-11 DIAGNOSIS — M810 Age-related osteoporosis without current pathological fracture: Secondary | ICD-10-CM | POA: Diagnosis not present

## 2022-08-11 DIAGNOSIS — H409 Unspecified glaucoma: Secondary | ICD-10-CM | POA: Diagnosis not present

## 2022-08-11 DIAGNOSIS — E1169 Type 2 diabetes mellitus with other specified complication: Secondary | ICD-10-CM | POA: Diagnosis not present

## 2022-08-11 DIAGNOSIS — Z853 Personal history of malignant neoplasm of breast: Secondary | ICD-10-CM | POA: Diagnosis not present

## 2022-08-11 DIAGNOSIS — E039 Hypothyroidism, unspecified: Secondary | ICD-10-CM | POA: Diagnosis not present

## 2022-08-11 DIAGNOSIS — E782 Mixed hyperlipidemia: Secondary | ICD-10-CM | POA: Diagnosis not present

## 2022-08-11 DIAGNOSIS — J309 Allergic rhinitis, unspecified: Secondary | ICD-10-CM | POA: Diagnosis not present

## 2022-08-13 ENCOUNTER — Other Ambulatory Visit: Payer: Self-pay | Admitting: Family Medicine

## 2022-08-13 DIAGNOSIS — M810 Age-related osteoporosis without current pathological fracture: Secondary | ICD-10-CM

## 2022-08-18 ENCOUNTER — Other Ambulatory Visit: Payer: Self-pay | Admitting: Hematology and Oncology

## 2022-08-18 DIAGNOSIS — Z1231 Encounter for screening mammogram for malignant neoplasm of breast: Secondary | ICD-10-CM

## 2022-11-09 ENCOUNTER — Other Ambulatory Visit: Payer: Self-pay

## 2022-11-09 ENCOUNTER — Inpatient Hospital Stay: Payer: Medicare Other | Attending: Adult Health | Admitting: Adult Health

## 2022-11-09 VITALS — BP 136/68 | HR 69 | Temp 98.1°F | Resp 16 | Ht 67.0 in | Wt 180.7 lb

## 2022-11-09 DIAGNOSIS — Z170 Estrogen receptor positive status [ER+]: Secondary | ICD-10-CM

## 2022-11-09 DIAGNOSIS — Z853 Personal history of malignant neoplasm of breast: Secondary | ICD-10-CM | POA: Insufficient documentation

## 2022-11-09 DIAGNOSIS — C50211 Malignant neoplasm of upper-inner quadrant of right female breast: Secondary | ICD-10-CM

## 2022-11-09 DIAGNOSIS — Z923 Personal history of irradiation: Secondary | ICD-10-CM | POA: Diagnosis not present

## 2022-11-09 NOTE — Progress Notes (Signed)
[facility] Follow up:    Pamela Neer, MD [address] [place] [address]   DIAGNOSIS:  Cancer Staging  Breast cancer of upper-inner quadrant of right female breast [facility]) Staging form: Breast, AJCC 7th Edition - Clinical stage from 02/25/2015: Stage 0 (Tis (DCIS), N0, M0) - Unsigned Laterality: Right - Pathologic stage from 03/19/2015: Stage IA (T1c, N0(i+), cM0) - Signed by Nicholas Lose, MD on 01/29/2016 Laterality: Right Tumor size (mm): 17 Estrogen receptor status: Positive Progesterone receptor status: Positive HER2 status: Negative IHC of regional lymph nodes: Positive Multi-gene signature risk of recurrence: Low risk Stage used in treatment planning: Yes National guidelines used in treatment planning: Yes Type of national guideline used in treatment planning: NCCN   SUMMARY OF ONCOLOGIC HISTORY: Oncology History  Breast cancer of upper-inner quadrant of right female breast ([place])  02/03/2015 Mammogram   Right breast: possible abnormality requiring further imaging   02/06/2015 Breast US   Right breast: no correlate seen   02/20/2015 Initial Biopsy   Right breast core needle bx: Invasive ductal carcinoma, grade 1, with DCIS; ER+ (99%), PR+ (98%), HER2/neu negative (ratio 1.71), Ki67 12%.   02/25/2015 Breast MRI   Right breast: 1.3 x 0.6 x 0.6 cm bilobed area of nodularity in the right breast, post biopsy change   02/25/2015 Clinical Stage   Stage IA: T1c N0   03/19/2015 Definitive Surgery   Right lumpectomy / [facility] Marlou Starks): IDC 1.7 cm with DCIS, micromet in one lymph node measuring 2 mm negative for ECS, other lymph node negative;  ER+ (99%), PR+ (98%), HER-2 negative, Ki-67 12%   03/19/2015 Pathologic Stage   Stage IIA: pT1c pN1a    03/19/2015 Procedure   Mammaprint low risk luminal A   05/05/2015 - 06/05/2015 Radiation Therapy   Adjuvant RT Valere Dross): Right breast 48 Gy over 24 fractions    06/19/2015 -  Anti-estrogen oral therapy    Anastrozole 1 mg daily. Switched to letrozole 08/05/2016 due to trigger fingers   09/11/2015 Survivorship   Survivorship visit completed and copy of care plan provided to patient   05/15/2018 Surgery   Left total hip arthroplasty     CURRENT THERAPY: observation  INTERVAL HISTORY: [name] 82 y.o. female returns for follow-up of her history of right-sided breast cancer.  She is status post bilateral screening mammogram most recently on April 22, 2022 demonstrating no mammographic evidence of malignancy and breast density category B.  [name] is doing well.  She has completed her letrozole therapy. She has no breast concerns, continues to see her PCP regularly, and exercises.    Patient Active Problem List   Diagnosis Date Noted   History of hip replacement 05/15/2018   Primary osteoarthritis of left hip    Pain of left hip joint 04/20/2018   Breast cancer of upper-inner quadrant of right female breast ([place]) 03/04/2015    is allergic to statins.  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  Internal hemorrhoid, bleeding    Personal history of radiation therapy 2016   Primary localized osteoarthritis of left hip    S/P radiation therapy 05/05/2015 through 06/05/2015         Right breast (high tangents) 4800 cGy in 24 sessions , no boost                       Wears glasses    Wears hearing aid    bilateral    SURGICAL HISTORY: Past Surgical History:  Procedure Laterality Date   ANTERIOR AND POSTERIOR REPAIR   06/10/2011   Procedure: ANTERIOR (CYSTOCELE) AND POSTERIOR REPAIR (RECTOCELE);  Surgeon: Blane Ohara Meisinger;  Location: [facility];  Service: Gynecology;  Laterality: N/A;   APPENDECTOMY  as child   BREAST BIOPSY Right 02/19/2017   BREAST BIOPSY Right 02/27/2019   fibrosis, fat necrosis, chronic inflamation    BREAST LUMPECTOMY Right 02/2015   BREAST SURGERY Right 03/19/15   lumpectomy/2 nodes   CARPAL TUNNEL RELEASE Left    CHOLECYSTECTOMY OPEN  1974   COLONOSCOPY W/ BIOPSIES AND POLYPECTOMY     EVALUATION UNDER ANESTHESIA WITH HEMORRHOIDECTOMY N/A 01/30/2016   Procedure: EXAM UNDER ANESTHESIA POSSIBLE HEMORRHOIDAL PEXY SINGLE COLUMN  HEMORRHOIDECTOMY;  Surgeon: [name], MD;  Location: [facility];  Service: General;  Laterality: N/A;   HAMMER TOE SURGERY Left    KNEE ARTHROSCOPY Right    ORIF RIGHT ANKLE FX  1999   RADIOACTIVE SEED GUIDED PARTIAL MASTECTOMY WITH AXILLARY SENTINEL LYMPH NODE BIOPSY Right 03/19/2015   Procedure: RIGHT BREAST LUMPECTOMY WITH RADIOACTIVE SEED AND SENTINEL LYMPH NODE MAPPING;  Surgeon: Autumn Messing III, MD;  Location: [facility];  Service: General;  Laterality: Right;   TONSILLECTOMY  as child   TOTAL HIP ARTHROPLASTY Left 05/15/2018   Procedure: LEFT TOTAL HIP ARTHROPLASTY ANTERIOR APPROACH;  Surgeon: Leandrew Koyanagi, MD;  Location: [facility];  Service: Orthopedics;  Laterality: Left;   TOTAL KNEE ARTHROPLASTY Right 05/23/2013   Procedure: Right Total Knee Arthroplasty;  Surgeon: Newt Minion, MD;  Location: [facility];  Service: Orthopedics;  Laterality: Right;  Right Total Knee Arthroplasty   TUBAL LIGATION     VAGINAL HYSTERECTOMY  07-07-2010   w/  Anterior & Posterior Repair and Solyx mid-urethral sling   VAGINAL PROLAPSE REPAIR  06/10/2011   Procedure: VAGINAL VAULT SUSPENSION;  Surgeon: Blane Ohara Meisinger;  Location: [facility];  Service: Gynecology;  Laterality: N/A;    SOCIAL HISTORY: Social History   Socioeconomic History   Marital  status: Married    Spouse name: Not on file   Number of children: Not on file   Years of education: Not on file   Highest education level: Not on file  Occupational History   Not on file  Tobacco Use   Smoking status: Never   Smokeless tobacco: Never  Vaping Use   Vaping Use: Never used  Substance and Sexual Activity   Alcohol use: No   Drug use: No   Sexual activity: Not on file  Other Topics Concern   Not on file  Social History Narrative   Not on file   Social Determinants of Health   Financial Resource Strain: Not on file  Food Insecurity: Not on file  Transportation Needs: Not on file  Physical Activity: Not on file  Stress: Not on file  Social Connections: Not on file  Intimate Partner Violence: Not on file    FAMILY HISTORY: Family History  Adopted: Yes    Review of Systems  Constitutional:  Negative for appetite change, chills, fatigue, fever and unexpected weight change.  HENT:   Negative for hearing loss, lump/mass and trouble swallowing.   Eyes:  Negative for eye problems and icterus.  Respiratory:  Negative for chest tightness, cough and shortness of breath.   Cardiovascular:  Negative for chest pain, leg swelling and palpitations.  Gastrointestinal:  Negative for abdominal distention, abdominal pain, constipation, diarrhea, nausea and vomiting.  Endocrine: Negative for hot flashes.  Genitourinary:  Negative for difficulty urinating.   Musculoskeletal:  Negative for arthralgias.  Skin:  Negative for itching and rash.  Neurological:  Negative for dizziness, extremity weakness, headaches and numbness.  Hematological:  Negative for adenopathy. Does not bruise/bleed easily.  Psychiatric/Behavioral:  Negative for depression. The patient is not nervous/anxious.       PHYSICAL EXAMINATION  ECOG PERFORMANCE STATUS: 0 - Asymptomatic  Vitals:   11/09/22 1129  BP: 136/68  Pulse: 69  Resp: 16  Temp: 98.1 F (36.7 C)  SpO2: 99%    Physical  Exam Constitutional:      General: She is not in acute distress.    Appearance: Normal appearance. She is not toxic-appearing.  HENT:     Head: Normocephalic and atraumatic.  Eyes:     General: No scleral icterus. Cardiovascular:     Rate and Rhythm: Normal rate and regular rhythm.     Pulses: Normal pulses.     Heart sounds: Normal heart sounds.  Pulmonary:     Effort: Pulmonary effort is normal.     Breath sounds: Normal breath sounds.  Chest:     Comments: Right breast s/p lumpectomy and radiation, no sign of local recurrence, left breast benign Abdominal:     General: Abdomen is flat. Bowel sounds are normal. There is no distension.     Palpations: Abdomen is soft.     Tenderness: There is no abdominal tenderness.  Musculoskeletal:        General: No swelling.     Cervical back: Neck supple.  Lymphadenopathy:     Cervical: No cervical adenopathy.  Skin:    General: Skin is warm and dry.     Findings: No rash.  Neurological:     General: No focal deficit present.     Mental Status: She is alert.  Psychiatric:        Mood and Affect: Mood normal.        Behavior: Behavior normal.     LABORATORY DATA: None for this visit    ASSESSMENT and THERAPY PLAN:   Breast cancer of upper-inner quadrant of right female breast Cassandre is an 82 year old woman with stage IIA ER positive breast cancer diagnosed in 2016 s/p lumpectomy, adjuvant radiation therapy, and antiestrogen therapy with Letrozole x 7 years.   Margreat has no clinical or radiographic signs of breast cancer recurrence.  She is recommended to continue annual mammograms. I also recommended that she undergo a clinical breast exam annually. She is welcome to return here, however her PCP can also examine her breasts every year.  She has opted to pursue this option.   I reviewed healthy diet and exercise recommendations. Sumayya is welcome to return at any point in the future for any questions or concerns that may  arise.    All questions were answered. The patient knows to call the clinic with any problems, questions or concerns. We can certainly see the patient much sooner if necessary.  Total encounter time:20 minutes*in face-to-face visit time, chart review, lab review, care coordination, order entry, and documentation of the encounter time.    Wilber Bihari, NP 11/11/22 9:34 PM Medical Oncology and Hematology [facility] [address] [place], [place] [address] Tel. [phone]    Fax. [phone]  *Total Encounter Time as defined by the Centers for Medicare and Medicaid Services includes, in addition to the face-to-face time of a patient visit (documented in the note above) non-face-to-face time: obtaining and reviewing outside history, ordering and reviewing medications, tests or procedures, care coordination (communications with other health care professionals or caregivers) and documentation in the medical record.

## 2022-11-11 NOTE — Assessment & Plan Note (Signed)
[name] is an 82 year old woman with stage IIA ER positive breast cancer diagnosed in 2016 s/p lumpectomy, adjuvant radiation therapy, and antiestrogen therapy with Letrozole x 7 years.   [name] has no clinical or radiographic signs of breast cancer recurrence.  She is recommended to continue annual mammograms. I also recommended that she undergo a clinical breast exam annually. She is welcome to return here, however her PCP can also examine her breasts every year.  She has opted to pursue this option.   I reviewed healthy diet and exercise recommendations. [name] is welcome to return at any point in the future for any questions or concerns that may arise.

## 2022-11-12 DIAGNOSIS — E039 Hypothyroidism, unspecified: Secondary | ICD-10-CM | POA: Diagnosis not present

## 2022-12-27 DIAGNOSIS — E039 Hypothyroidism, unspecified: Secondary | ICD-10-CM | POA: Diagnosis not present

## 2023-01-11 DIAGNOSIS — H0100A Unspecified blepharitis right eye, upper and lower eyelids: Secondary | ICD-10-CM | POA: Diagnosis not present

## 2023-01-11 DIAGNOSIS — H0100B Unspecified blepharitis left eye, upper and lower eyelids: Secondary | ICD-10-CM | POA: Diagnosis not present

## 2023-01-11 DIAGNOSIS — H401123 Primary open-angle glaucoma, left eye, severe stage: Secondary | ICD-10-CM | POA: Diagnosis not present

## 2023-01-11 DIAGNOSIS — H04123 Dry eye syndrome of bilateral lacrimal glands: Secondary | ICD-10-CM | POA: Diagnosis not present

## 2023-01-31 DIAGNOSIS — E782 Mixed hyperlipidemia: Secondary | ICD-10-CM | POA: Diagnosis not present

## 2023-01-31 DIAGNOSIS — E1169 Type 2 diabetes mellitus with other specified complication: Secondary | ICD-10-CM | POA: Diagnosis not present

## 2023-01-31 DIAGNOSIS — E039 Hypothyroidism, unspecified: Secondary | ICD-10-CM | POA: Diagnosis not present

## 2023-04-25 ENCOUNTER — Ambulatory Visit
Admission: RE | Admit: 2023-04-25 | Discharge: 2023-04-25 | Disposition: A | Discharge status: Home / Self Care | Payer: Medicare Other | Source: Ambulatory Visit | Attending: Family Medicine | Admitting: Family Medicine

## 2023-04-25 ENCOUNTER — Ambulatory Visit
Admission: RE | Admit: 2023-04-25 | Discharge: 2023-04-25 | Disposition: A | Discharge status: Home / Self Care | Payer: Medicare Other | Source: Ambulatory Visit | Attending: Hematology and Oncology | Admitting: Hematology and Oncology

## 2023-04-25 DIAGNOSIS — E349 Endocrine disorder, unspecified: Secondary | ICD-10-CM | POA: Diagnosis not present

## 2023-04-25 DIAGNOSIS — M8588 Other specified disorders of bone density and structure, other site: Secondary | ICD-10-CM | POA: Diagnosis not present

## 2023-04-25 DIAGNOSIS — Z1231 Encounter for screening mammogram for malignant neoplasm of breast: Secondary | ICD-10-CM | POA: Diagnosis not present

## 2023-04-25 DIAGNOSIS — M810 Age-related osteoporosis without current pathological fracture: Secondary | ICD-10-CM

## 2023-05-30 DIAGNOSIS — E119 Type 2 diabetes mellitus without complications: Secondary | ICD-10-CM | POA: Diagnosis not present

## 2023-05-30 DIAGNOSIS — E039 Hypothyroidism, unspecified: Secondary | ICD-10-CM | POA: Diagnosis not present

## 2023-05-30 DIAGNOSIS — R42 Dizziness and giddiness: Secondary | ICD-10-CM | POA: Diagnosis not present

## 2023-05-30 DIAGNOSIS — M810 Age-related osteoporosis without current pathological fracture: Secondary | ICD-10-CM | POA: Diagnosis not present

## 2023-07-26 DIAGNOSIS — H401123 Primary open-angle glaucoma, left eye, severe stage: Secondary | ICD-10-CM | POA: Diagnosis not present

## 2023-07-26 DIAGNOSIS — H0100A Unspecified blepharitis right eye, upper and lower eyelids: Secondary | ICD-10-CM | POA: Diagnosis not present

## 2023-07-26 DIAGNOSIS — H43813 Vitreous degeneration, bilateral: Secondary | ICD-10-CM | POA: Diagnosis not present

## 2023-07-26 DIAGNOSIS — H04123 Dry eye syndrome of bilateral lacrimal glands: Secondary | ICD-10-CM | POA: Diagnosis not present

## 2023-07-26 DIAGNOSIS — H5203 Hypermetropia, bilateral: Secondary | ICD-10-CM | POA: Diagnosis not present

## 2023-07-26 DIAGNOSIS — H0100B Unspecified blepharitis left eye, upper and lower eyelids: Secondary | ICD-10-CM | POA: Diagnosis not present

## 2023-08-17 DIAGNOSIS — M810 Age-related osteoporosis without current pathological fracture: Secondary | ICD-10-CM | POA: Diagnosis not present

## 2023-08-17 DIAGNOSIS — E782 Mixed hyperlipidemia: Secondary | ICD-10-CM | POA: Diagnosis not present

## 2023-08-17 DIAGNOSIS — J301 Allergic rhinitis due to pollen: Secondary | ICD-10-CM | POA: Diagnosis not present

## 2023-08-17 DIAGNOSIS — H409 Unspecified glaucoma: Secondary | ICD-10-CM | POA: Diagnosis not present

## 2023-08-17 DIAGNOSIS — Z9181 History of falling: Secondary | ICD-10-CM | POA: Diagnosis not present

## 2023-08-17 DIAGNOSIS — E039 Hypothyroidism, unspecified: Secondary | ICD-10-CM | POA: Diagnosis not present

## 2023-08-17 DIAGNOSIS — Z853 Personal history of malignant neoplasm of breast: Secondary | ICD-10-CM | POA: Diagnosis not present

## 2023-08-17 DIAGNOSIS — Z0000 Encounter for general adult medical examination without abnormal findings: Secondary | ICD-10-CM | POA: Diagnosis not present

## 2023-08-17 DIAGNOSIS — E119 Type 2 diabetes mellitus without complications: Secondary | ICD-10-CM | POA: Diagnosis not present

## 2023-08-17 DIAGNOSIS — E1169 Type 2 diabetes mellitus with other specified complication: Secondary | ICD-10-CM | POA: Diagnosis not present

## 2024-01-26 DIAGNOSIS — H04123 Dry eye syndrome of bilateral lacrimal glands: Secondary | ICD-10-CM | POA: Diagnosis not present

## 2024-01-26 DIAGNOSIS — H401123 Primary open-angle glaucoma, left eye, severe stage: Secondary | ICD-10-CM | POA: Diagnosis not present

## 2024-02-15 DIAGNOSIS — E039 Hypothyroidism, unspecified: Secondary | ICD-10-CM | POA: Diagnosis not present

## 2024-02-15 DIAGNOSIS — R7303 Prediabetes: Secondary | ICD-10-CM | POA: Diagnosis not present

## 2024-02-15 DIAGNOSIS — E782 Mixed hyperlipidemia: Secondary | ICD-10-CM | POA: Diagnosis not present

## 2024-03-13 ENCOUNTER — Other Ambulatory Visit: Payer: Self-pay | Admitting: Family Medicine

## 2024-03-13 DIAGNOSIS — Z1231 Encounter for screening mammogram for malignant neoplasm of breast: Secondary | ICD-10-CM

## 2024-04-25 ENCOUNTER — Ambulatory Visit
Admission: RE | Admit: 2024-04-25 | Discharge: 2024-04-25 | Disposition: A | Discharge status: Home / Self Care | Source: Ambulatory Visit | Attending: Family Medicine | Admitting: Family Medicine

## 2024-04-25 DIAGNOSIS — Z1231 Encounter for screening mammogram for malignant neoplasm of breast: Secondary | ICD-10-CM

## 2024-05-24 DIAGNOSIS — E1169 Type 2 diabetes mellitus with other specified complication: Secondary | ICD-10-CM | POA: Diagnosis not present

## 2024-05-24 DIAGNOSIS — M810 Age-related osteoporosis without current pathological fracture: Secondary | ICD-10-CM | POA: Diagnosis not present

## 2024-05-24 DIAGNOSIS — H409 Unspecified glaucoma: Secondary | ICD-10-CM | POA: Diagnosis not present

## 2024-05-24 DIAGNOSIS — Z853 Personal history of malignant neoplasm of breast: Secondary | ICD-10-CM | POA: Diagnosis not present

## 2024-06-24 DIAGNOSIS — Z853 Personal history of malignant neoplasm of breast: Secondary | ICD-10-CM | POA: Diagnosis not present

## 2024-06-24 DIAGNOSIS — E1169 Type 2 diabetes mellitus with other specified complication: Secondary | ICD-10-CM | POA: Diagnosis not present

## 2024-06-24 DIAGNOSIS — M810 Age-related osteoporosis without current pathological fracture: Secondary | ICD-10-CM | POA: Diagnosis not present

## 2024-06-24 DIAGNOSIS — H409 Unspecified glaucoma: Secondary | ICD-10-CM | POA: Diagnosis not present

## 2024-07-24 DIAGNOSIS — H409 Unspecified glaucoma: Secondary | ICD-10-CM | POA: Diagnosis not present

## 2024-07-24 DIAGNOSIS — Z853 Personal history of malignant neoplasm of breast: Secondary | ICD-10-CM | POA: Diagnosis not present

## 2024-07-24 DIAGNOSIS — E1169 Type 2 diabetes mellitus with other specified complication: Secondary | ICD-10-CM | POA: Diagnosis not present

## 2024-07-24 DIAGNOSIS — M810 Age-related osteoporosis without current pathological fracture: Secondary | ICD-10-CM | POA: Diagnosis not present

## 2024-08-02 DIAGNOSIS — H04123 Dry eye syndrome of bilateral lacrimal glands: Secondary | ICD-10-CM | POA: Diagnosis not present

## 2024-08-02 DIAGNOSIS — H0100A Unspecified blepharitis right eye, upper and lower eyelids: Secondary | ICD-10-CM | POA: Diagnosis not present

## 2024-08-02 DIAGNOSIS — H401123 Primary open-angle glaucoma, left eye, severe stage: Secondary | ICD-10-CM | POA: Diagnosis not present

## 2024-08-02 DIAGNOSIS — H524 Presbyopia: Secondary | ICD-10-CM | POA: Diagnosis not present

## 2024-08-02 DIAGNOSIS — H0100B Unspecified blepharitis left eye, upper and lower eyelids: Secondary | ICD-10-CM | POA: Diagnosis not present

## 2024-08-02 DIAGNOSIS — H43813 Vitreous degeneration, bilateral: Secondary | ICD-10-CM | POA: Diagnosis not present

## 2024-08-02 DIAGNOSIS — H52203 Unspecified astigmatism, bilateral: Secondary | ICD-10-CM | POA: Diagnosis not present

## 2024-08-02 DIAGNOSIS — H35371 Puckering of macula, right eye: Secondary | ICD-10-CM | POA: Diagnosis not present

## 2024-08-03 DIAGNOSIS — B078 Other viral warts: Secondary | ICD-10-CM | POA: Diagnosis not present

## 2024-08-03 DIAGNOSIS — L720 Epidermal cyst: Secondary | ICD-10-CM | POA: Diagnosis not present

## 2024-08-03 DIAGNOSIS — L728 Other follicular cysts of the skin and subcutaneous tissue: Secondary | ICD-10-CM | POA: Diagnosis not present

## 2024-08-03 DIAGNOSIS — L821 Other seborrheic keratosis: Secondary | ICD-10-CM | POA: Diagnosis not present

## 2024-08-16 DIAGNOSIS — Z23 Encounter for immunization: Secondary | ICD-10-CM | POA: Diagnosis not present

## 2024-08-16 DIAGNOSIS — E1169 Type 2 diabetes mellitus with other specified complication: Secondary | ICD-10-CM | POA: Diagnosis not present

## 2024-08-16 DIAGNOSIS — M810 Age-related osteoporosis without current pathological fracture: Secondary | ICD-10-CM | POA: Diagnosis not present

## 2024-08-16 DIAGNOSIS — E782 Mixed hyperlipidemia: Secondary | ICD-10-CM | POA: Diagnosis not present

## 2024-08-16 DIAGNOSIS — H409 Unspecified glaucoma: Secondary | ICD-10-CM | POA: Diagnosis not present

## 2024-08-16 DIAGNOSIS — E039 Hypothyroidism, unspecified: Secondary | ICD-10-CM | POA: Diagnosis not present

## 2024-08-16 DIAGNOSIS — Z0000 Encounter for general adult medical examination without abnormal findings: Secondary | ICD-10-CM | POA: Diagnosis not present

## 2024-08-16 DIAGNOSIS — Z853 Personal history of malignant neoplasm of breast: Secondary | ICD-10-CM | POA: Diagnosis not present
